# Patient Record
Sex: Male | Born: 1950
Health system: Southern US, Community
[De-identification: ages and names within clinical notes are randomized; demographics above are authoritative.]

## PROBLEM LIST (undated history)

## (undated) DIAGNOSIS — Z972 Presence of dental prosthetic device (complete) (partial): Secondary | ICD-10-CM

## (undated) DIAGNOSIS — E785 Hyperlipidemia, unspecified: Secondary | ICD-10-CM

## (undated) DIAGNOSIS — C4490 Unspecified malignant neoplasm of skin, unspecified: Secondary | ICD-10-CM

## (undated) DIAGNOSIS — N400 Enlarged prostate without lower urinary tract symptoms: Secondary | ICD-10-CM

## (undated) DIAGNOSIS — G4730 Sleep apnea, unspecified: Secondary | ICD-10-CM

## (undated) DIAGNOSIS — I10 Essential (primary) hypertension: Secondary | ICD-10-CM

## (undated) DIAGNOSIS — F419 Anxiety disorder, unspecified: Secondary | ICD-10-CM

## (undated) DIAGNOSIS — F329 Major depressive disorder, single episode, unspecified: Secondary | ICD-10-CM

## (undated) DIAGNOSIS — F32A Depression, unspecified: Secondary | ICD-10-CM

## (undated) HISTORY — DX: Unspecified malignant neoplasm of skin, unspecified: C44.90

## (undated) HISTORY — DX: Depression, unspecified: F32.A

## (undated) HISTORY — DX: Anxiety disorder, unspecified: F41.9

## (undated) HISTORY — DX: Hyperlipidemia, unspecified: E78.5

## (undated) HISTORY — DX: Major depressive disorder, single episode, unspecified: F32.9

## (undated) HISTORY — PX: HAND SURGERY: SHX662

## (undated) HISTORY — PX: APPENDECTOMY: SHX54

## (undated) HISTORY — PX: OTHER SURGICAL HISTORY: SHX169

## (undated) HISTORY — PX: SKIN CANCER EXCISION: SHX779

## (undated) HISTORY — PX: SEPTOPLASTY: SUR1290

## (undated) HISTORY — DX: Sleep apnea, unspecified: G47.30

## (undated) HISTORY — DX: Benign prostatic hyperplasia without lower urinary tract symptoms: N40.0

## (undated) HISTORY — PX: HERNIA REPAIR: SHX51

## (undated) HISTORY — DX: Essential (primary) hypertension: I10

---

## 2005-05-08 ENCOUNTER — Ambulatory Visit: Payer: Self-pay | Admitting: Family Medicine

## 2008-03-12 ENCOUNTER — Ambulatory Visit: Payer: Self-pay | Admitting: Unknown Physician Specialty

## 2009-01-01 ENCOUNTER — Ambulatory Visit: Payer: Self-pay | Admitting: Internal Medicine

## 2009-01-04 ENCOUNTER — Inpatient Hospital Stay: Payer: Self-pay | Admitting: Surgery

## 2009-01-31 ENCOUNTER — Ambulatory Visit: Payer: Self-pay | Admitting: Surgery

## 2009-03-31 ENCOUNTER — Ambulatory Visit: Payer: Self-pay | Admitting: Surgery

## 2009-04-01 ENCOUNTER — Inpatient Hospital Stay: Payer: Self-pay | Admitting: Surgery

## 2010-08-24 ENCOUNTER — Ambulatory Visit: Payer: Self-pay | Admitting: Surgery

## 2010-08-31 ENCOUNTER — Ambulatory Visit: Payer: Self-pay | Admitting: Surgery

## 2011-08-14 HISTORY — PX: COLONOSCOPY: SHX174

## 2011-08-16 ENCOUNTER — Ambulatory Visit: Payer: Self-pay | Admitting: Surgery

## 2011-09-06 ENCOUNTER — Ambulatory Visit: Payer: Self-pay | Admitting: Surgery

## 2011-09-06 LAB — BASIC METABOLIC PANEL
Anion Gap: 11 (ref 7–16)
BUN: 17 mg/dL (ref 7–18)
Calcium, Total: 9.1 mg/dL (ref 8.5–10.1)
Creatinine: 0.94 mg/dL (ref 0.60–1.30)
EGFR (African American): >60
EGFR (Non-African Amer.): >60
Glucose: 99 mg/dL (ref 65–99)
Osmolality: 279 (ref 275–301)
Potassium: 4.1 mmol/L (ref 3.5–5.1)

## 2011-09-06 LAB — CBC WITH DIFFERENTIAL/PLATELET
Basophil %: 0.6 %
Eosinophil #: 0.2 10*3/uL (ref 0.0–0.7)
Eosinophil %: 3.3 %
HCT: 43.1 % (ref 40.0–52.0)
Lymphocyte #: 1.7 10*3/uL (ref 1.0–3.6)
Lymphocyte %: 27.5 %
MCH: 30.8 pg (ref 26.0–34.0)
MCHC: 34.3 g/dL (ref 32.0–36.0)
MCV: 90 fL (ref 80–100)
Monocyte %: 13.2 %
Neutrophil #: 3.5 10*3/uL (ref 1.4–6.5)
RBC: 4.79 10*6/uL (ref 4.40–5.90)
RDW: 13.1 % (ref 11.5–14.5)

## 2011-09-13 ENCOUNTER — Ambulatory Visit: Payer: Self-pay | Admitting: Surgery

## 2011-09-14 LAB — CBC WITH DIFFERENTIAL/PLATELET
Basophil #: 0 10*3/uL (ref 0.0–0.1)
Eosinophil #: 0.2 10*3/uL (ref 0.0–0.7)
Lymphocyte #: 0.8 10*3/uL — ABNORMAL LOW (ref 1.0–3.6)
Lymphocyte %: 9.8 %
MCH: 30.5 pg (ref 26.0–34.0)
MCV: 91 fL (ref 80–100)
Monocyte #: 0.8 10*3/uL — ABNORMAL HIGH (ref 0.0–0.7)
Monocyte %: 9.6 %
Neutrophil %: 78.4 %
Platelet: 211 10*3/uL (ref 150–440)
RBC: 4.28 10*6/uL — ABNORMAL LOW (ref 4.40–5.90)
RDW: 13.2 % (ref 11.5–14.5)

## 2011-09-17 LAB — PATHOLOGY REPORT

## 2012-02-11 ENCOUNTER — Ambulatory Visit: Payer: Self-pay | Admitting: Unknown Physician Specialty

## 2012-02-12 LAB — PATHOLOGY REPORT

## 2012-06-09 ENCOUNTER — Ambulatory Visit: Payer: Self-pay | Admitting: Unknown Physician Specialty

## 2012-06-30 ENCOUNTER — Ambulatory Visit: Payer: Self-pay | Admitting: Unknown Physician Specialty

## 2012-07-22 ENCOUNTER — Ambulatory Visit: Payer: Self-pay | Admitting: Unknown Physician Specialty

## 2014-11-30 NOTE — Op Note (Signed)
PATIENT NAME:  Dennis Navarro, Dennis Navarro MR#:  [id number] DATE OF BIRTH:  05/19/1951  DATE OF PROCEDURE:  07/22/2012  PREOPERATIVE DIAGNOSIS: Left ear cholesteatoma.   POSTOPERATIVE DIAGNOSIS:  Left ear granulation tissue with significant middle ear adhesions.   PROCEDURE:  Left postauricular tympanoplasty with lysis of adhesions and removal of granulation tissue.   SURGEON: Roena Malady, M.D.   ANESTHESIA:  General endotracheal.   OPERATIVE FINDINGS: There was significant blunting of the anterior angle of the tympanic membrane with the ear canal wall as well as granulation tissue in the anterior superior quadrant. There were also adhesions from the lenticular process of the incus to the malleus. There was no evidence of cholesteatoma which I could identify.   DESCRIPTION OF PROCEDURE: Kutler was identified in the holding area, taken to the Operating Room and placed in the supine position. After general endotracheal anesthesia, the facial nerve monitor was applied in case of possible mastoidectomy. With this applied, the left ear was prepped and draped sterilely. A local anesthetic of 1% lidocaine with 1:100,000 of epinephrine was used to inject the conchal bowl, the postauricular crease, and the ear canal. A total of 5 mL was used. With the ear prepped and draped sterilely, the operating microscope was brought into the field. Examination of the ear canal showed significant debris within the ear canal which was removed. As this was traced down to the tympanic membrane, there was significant keratinous debris of the ear canal itself and so I peeled this off anteriorly. It was very difficult to see the anterior angle due to the anatomy of the ear canal itself. There did appear to be significant granulation tissue around this curve of the anterior ear canal. However, it was difficult to visualize. The superior portion of the tympanic membrane appeared to be intact as did the posterior aspect. It was felt  that a postauricular incision would be needed to view this anterior portion of the tympanic membrane. Therefore, a tympanomeatal incision was created from 6 to 12:00 in the posterior canal and a cotton ball with adrenaline was then placed. A postauricular incision was taken down to the temporalis fascia. A fascia graft was harvested for preparation of possible tympanoplasty using a graft. Hemostasis was achieved using the Bovie cautery. A V-shaped incision was done over the mastoid, taken down the pericranium where pericranial flap was elevated anteriorly and down the ear canal over the spine of Henley. This was then placed in a retractor as the ear canal was gently elevated using an endaural knife. The tympanomeatal incision which I previously created was encountered. The cotton ball was removed. This gave excellent visualization of the tympanic membrane anteriorly. There was indeed significant granulation tissue anteriorly which was cleaned off of the drum. There was a small flap anteriorly where the granulation tissue had been. I gently probed through this into the middle ear space and was able to visualize the middle ear space as if through a myringotomy incision and there was no evidence of cholesteatoma anteriorly. This small flap was then laid back into anatomic position much like a tympanoplasty graft. The tympanomeatal flap was then elevated down to the annulus. The chorda tympani nerve was identified and preserved throughout the case. There were significant fibrous bands between the chorda tympani in the annulus. These were gently divided. As the middle ear space was entered inferiorly, it appeared normal. There was no evidence of infection. No evidence of effusion. No evidence of granulation tissue inferiorly. As the dissection proceeded  superiorly, the lenticular process of the incus could be identified. There were significant fibrous bands between the lenticular process of the incus and the posterior  aspect of the handle of the malleus. These were gently lysed using the straight needle. Dissection proceeded superiorly. There did not appear to be any scutal erosion. There did not appear to be any evidence of cholesteatoma around the scutum up lateral to the edge of the malleus and incus. I felt at this point that to proceed further was not warranted, that removal of the significant granulation tissue anteriorly and lysis of adhesions would significantly improve his hearing as well as the intermittent drainage. Therefore, the tympanomeatal flap was laid back in an anatomic position. The area anteriorly was reinspected. All granulation tissue which could be visualized was removed. The tympanomeatal flap was laid back into position as was the pericranial flap. This was sutured in position using 4-0 Vicryl. The subcutaneous layers of the postauricular incision were closed using 4-0 Vicryl. The ear canal was then re-examined with the speculum. The tympanomeatal flap was laid in excellent position. The ear canal was then filled with bacitracin ointment. Two small pieces of Gelfoam impregnated with adrenaline was placed along the tympanomeatal incision. The patient was then returned to anesthesia where he was extubated in the Operating Room and taken to the recovery room in stable condition following placement of Glasscock dressing.   CULTURES: None.   SPECIMENS: None.      ESTIMATED BLOOD LOSS: Less than 20 milliliters.   ____________________________ Roena Malady, MD ctm:ap D: 07/22/2012 11:19:35 ET T: 07/22/2012 12:03:02 ET JOB#: [id number]  cc: Roena Malady, MD, <Dictator> Roena Malady MD ELECTRONICALLY SIGNED 07/25/2012 9:24

## 2014-12-05 NOTE — Op Note (Signed)
PATIENT NAME:  Dennis Navarro, Dennis Navarro MR#:  [id number] DATE OF BIRTH:  [birth year]  DATE OF PROCEDURE:  09/13/2011  PREOPERATIVE DIAGNOSIS: Recurrent ventral hernia.   POSTOPERATIVE DIAGNOSIS: Recurrent ventral hernia.   PROCEDURE PERFORMED: Ventral hernia repair.   SURGEON: Rodena Goldmann, MD   ANESTHESIA:  General.  OPERATIVE PROCEDURE: With the patient in the supine position and after the induction of appropriate general anesthesia, the patient's abdomen was prepped with ChloraPrep and draped with sterile towels. A curvilinear incision was made along the medial border of the previous incision and carried down through the subcutaneous with Bovie electrocautery. The sac was identified without difficulty and dissected back to its base in the fascia. This fascia was cleaned circumferentially. The sac was opened, the bowel reduced, and the total defect appeared to be approximately the size of a silver dollar. The sac was removed without difficulty and the edges cleaned. The fascia was released in order to allow for closure primarily. The defect was felt to be too big to accomplish satisfactory primary closure. A piece of [employer] mesh was brought to the table and placed in the defect with the smooth side down. It was sutured in place with 0 Surgilon. The repair appeared to be satisfactory. The fascia was closed over the defect and mesh using 0 Prolene. The area was infiltrated with 0.25% Marcaine for postoperative pain control. A separate stab wound was created and a JP drain placed without difficulty. The skin was clipped. The drain was secured with 3-0 nylon. Sterile dressings were applied. The patient was returned to the recovery room having tolerated the procedure well. Sponge, instrument, and needle counts were correct times two in the operating room.    ____________________________ Rodena Goldmann III, MD rle:bjt D:  09/13/2011 14:25:28 ET          T: 09/13/2011 14:54:48 ET          JOB#: [id number]  cc: Micheline Maze, MD, <Dictator> Juline Patch, MD Rodena Goldmann MD ELECTRONICALLY SIGNED 09/23/2011 21:12

## 2015-04-25 ENCOUNTER — Other Ambulatory Visit: Payer: Self-pay | Admitting: Family Medicine

## 2015-04-25 DIAGNOSIS — E785 Hyperlipidemia, unspecified: Secondary | ICD-10-CM

## 2015-05-02 ENCOUNTER — Ambulatory Visit (INDEPENDENT_AMBULATORY_CARE_PROVIDER_SITE_OTHER): Payer: BLUE CROSS/BLUE SHIELD | Admitting: Family Medicine

## 2015-05-02 ENCOUNTER — Encounter: Payer: Self-pay | Admitting: Family Medicine

## 2015-05-02 DIAGNOSIS — G4733 Obstructive sleep apnea (adult) (pediatric): Secondary | ICD-10-CM | POA: Diagnosis not present

## 2015-05-02 NOTE — Progress Notes (Addendum)
Name: Dennis Navarro   MRN: [id number]    DOB: [birth year]   Date:05/02/2015       Progress Note  Subjective  Chief Complaint  Chief Complaint  Patient presents with  . cpap    needs new CPAP and equipment- almost 64 years old    Insomnia Primary symptoms: fragmented sleep, somnolence, malaise/fatigue, napping.  The current episode started more than one year. The onset quality is gradual. The problem occurs nightly. The problem has been gradually worsening since onset. Exacerbated by: supine position/  PMH includes: associated symptoms present, no hypertension, no depression, no family stress or anxiety, no restless leg syndrome, no work related stressors, no chronic pain, apnea.     No problem-specific assessment & plan notes found for this encounter.   Past Medical History  Diagnosis Date  . Hypertension   . Hyperlipidemia   . Depression   . Anxiety     History reviewed. No pertinent past surgical history.  History reviewed. No pertinent family history.  Social History   Social History  . Marital Status: Married    Spouse Name: N/A  . Number of Children: N/A  . Years of Education: N/A   Occupational History  . Not on file.   Social History Main Topics  . Smoking status: Former Research scientist (life sciences)  . Smokeless tobacco: Not on file  . Alcohol Use: Not on file  . Drug Use: Not on file  . Sexual Activity: Yes   Other Topics Concern  . Not on file   Social History Narrative  . No narrative on file    No Known Allergies   Review of Systems  Constitutional: Positive for malaise/fatigue. Negative for fever, chills and weight loss.  HENT: Negative for ear discharge, ear pain and sore throat.   Eyes: Negative for blurred vision.  Respiratory: Positive for apnea. Negative for cough, sputum production, shortness of breath and wheezing.   Cardiovascular: Negative for chest pain, palpitations and leg swelling.  Gastrointestinal: Negative for heartburn, nausea, abdominal  pain, diarrhea, constipation, blood in stool and melena.  Genitourinary: Negative for dysuria, urgency, frequency and hematuria.  Musculoskeletal: Negative for myalgias, back pain, joint pain and neck pain.  Skin: Negative for rash.  Neurological: Negative for dizziness, tingling, sensory change, focal weakness and headaches.  Endo/Heme/Allergies: Negative for environmental allergies and polydipsia. Does not bruise/bleed easily.  Psychiatric/Behavioral: Negative for depression and suicidal ideas. The patient has insomnia. The patient is not nervous/anxious.      Objective  Filed Vitals:   05/02/15 1422  BP: 120/64  Pulse: 60  Height: 6' (1.829 m)  Weight: 209 lb (94.802 kg)    Physical Exam  Constitutional: He is oriented to person, place, and time and well-developed, well-nourished, and in no distress.  HENT:  Head: Normocephalic.  Right Ear: External ear normal.  Left Ear: External ear normal.  Nose: Nose normal.  Mouth/Throat: Oropharynx is clear and moist.  Eyes: Conjunctivae and EOM are normal. Pupils are equal, round, and reactive to light. Right eye exhibits no discharge. Left eye exhibits no discharge. No scleral icterus.  Neck: Normal range of motion. Neck supple. No JVD present. No tracheal deviation present. No thyromegaly present.  Cardiovascular: Normal rate, regular rhythm, normal heart sounds and intact distal pulses.  Exam reveals no gallop and no friction rub.   No murmur heard. Pulmonary/Chest: Breath sounds normal. No respiratory distress. He has no wheezes. He has no rales.  Abdominal: Soft. Bowel sounds are normal. He exhibits no  mass. There is no hepatosplenomegaly. There is no tenderness. There is no rebound, no guarding and no CVA tenderness.  Musculoskeletal: Normal range of motion. He exhibits no edema or tenderness.  Lymphadenopathy:    He has no cervical adenopathy.  Neurological: He is alert and oriented to person, place, and time. He has normal  sensation, normal strength, normal reflexes and intact cranial nerves. No cranial nerve deficit.  Skin: Skin is warm. No rash noted.  Psychiatric: Mood and affect normal.      Assessment & Plan  Problem List Items Addressed This Visit    None    Visit Diagnoses    Obstructive sleep apnea        prescription for new cpap and equipment         Dr. Otilio Miu [facility]  05/02/2015

## 2015-05-13 ENCOUNTER — Other Ambulatory Visit: Payer: Self-pay

## 2015-06-01 ENCOUNTER — Other Ambulatory Visit: Payer: Self-pay | Admitting: Family Medicine

## 2015-06-08 ENCOUNTER — Other Ambulatory Visit: Payer: Self-pay

## 2015-06-08 DIAGNOSIS — G4730 Sleep apnea, unspecified: Secondary | ICD-10-CM

## 2015-08-01 ENCOUNTER — Other Ambulatory Visit: Payer: Self-pay | Admitting: Family Medicine

## 2015-08-30 ENCOUNTER — Other Ambulatory Visit: Payer: Self-pay | Admitting: Family Medicine

## 2015-09-05 ENCOUNTER — Encounter: Payer: Self-pay | Admitting: Family Medicine

## 2015-09-05 ENCOUNTER — Ambulatory Visit (INDEPENDENT_AMBULATORY_CARE_PROVIDER_SITE_OTHER): Payer: BLUE CROSS/BLUE SHIELD | Admitting: Family Medicine

## 2015-09-05 VITALS — BP 140/80 | HR 78 | Temp 99.6°F | Ht 72.0 in | Wt 210.0 lb

## 2015-09-05 DIAGNOSIS — J0100 Acute maxillary sinusitis, unspecified: Secondary | ICD-10-CM

## 2015-09-05 DIAGNOSIS — R509 Fever, unspecified: Secondary | ICD-10-CM

## 2015-09-05 LAB — POCT INFLUENZA A/B
INFLUENZA A, POC: NEGATIVE
INFLUENZA B, POC: NEGATIVE

## 2015-09-05 MED ORDER — AMOXICILLIN-POT CLAVULANATE 875-125 MG PO TABS: 1.0000 | ORAL_TABLET | Freq: Two times a day (BID) | ORAL | Status: DC

## 2015-09-05 NOTE — Progress Notes (Signed)
Name: Dennis Navarro   MRN: [id number]    DOB: 11/09/1950   Date:09/05/2015       Progress Note  Subjective  Chief Complaint  Chief Complaint  Patient presents with  . Sinusitis    cough and cong    Sinusitis This is a new problem. The current episode started in the past 7 days. The problem has been gradually worsening since onset. The maximum temperature recorded prior to his arrival was 100.4 - 100.9 F. His pain is at a severity of 4/10. The pain is mild. Associated symptoms include chills, congestion, coughing, diaphoresis, headaches, a hoarse voice, sinus pressure, sneezing, a sore throat and swollen glands. Pertinent negatives include no ear pain, neck pain or shortness of breath. Past treatments include acetaminophen. The treatment provided mild relief.    No problem-specific assessment & plan notes found for this encounter.   Past Medical History  Diagnosis Date  . Hypertension   . Hyperlipidemia   . Depression   . Anxiety     Past Surgical History  Procedure Laterality Date  . Septoplasty    . Hernia repair      x 3  . Appendectomy    . Skin cancer excision      under tongue  . Hand surgery Bilateral   . Colonoscopy  2013    cleared for 5 yrs- Dr Vira Agar    History reviewed. No pertinent family history.  Social History   Social History  . Marital Status: Married    Spouse Name: N/A  . Number of Children: N/A  . Years of Education: N/A   Occupational History  . Not on file.   Social History Main Topics  . Smoking status: Former Research scientist (life sciences)  . Smokeless tobacco: Not on file  . Alcohol Use: Not on file  . Drug Use: Not on file  . Sexual Activity: Yes   Other Topics Concern  . Not on file   Social History Narrative    No Known Allergies   Review of Systems  Constitutional: Positive for fever, chills and diaphoresis. Negative for weight loss and malaise/fatigue.  HENT: Positive for congestion, hoarse voice, sinus pressure, sneezing and sore  throat. Negative for ear discharge and ear pain.   Eyes: Negative for blurred vision.  Respiratory: Positive for cough. Negative for sputum production, shortness of breath and wheezing.   Cardiovascular: Negative for chest pain, palpitations and leg swelling.  Gastrointestinal: Negative for heartburn, nausea, abdominal pain, diarrhea, constipation, blood in stool and melena.  Genitourinary: Negative for dysuria, urgency, frequency and hematuria.  Musculoskeletal: Negative for myalgias, back pain, joint pain and neck pain.  Skin: Negative for rash.  Neurological: Positive for headaches. Negative for dizziness, tingling, sensory change and focal weakness.  Endo/Heme/Allergies: Negative for environmental allergies and polydipsia. Does not bruise/bleed easily.  Psychiatric/Behavioral: Negative for depression and suicidal ideas. The patient is not nervous/anxious and does not have insomnia.      Objective  Filed Vitals:   09/05/15 0925  BP: 140/80  Pulse: 78  Temp: 99.6 F (37.6 C)  TempSrc: Oral  Height: 6' (1.829 m)  Weight: 210 lb (95.255 kg)    Physical Exam  Constitutional: He is oriented to person, place, and time and well-developed, well-nourished, and in no distress.  HENT:  Head: Normocephalic.  Right Ear: External ear normal.  Left Ear: External ear normal.  Nose: Nose normal.  Mouth/Throat: Oropharynx is clear and moist.  Eyes: Conjunctivae and EOM are normal. Pupils are equal,  round, and reactive to light. Right eye exhibits no discharge. Left eye exhibits no discharge. No scleral icterus.  Neck: Normal range of motion. Neck supple. No JVD present. No tracheal deviation present. No thyromegaly present.  Cardiovascular: Normal rate, regular rhythm, normal heart sounds and intact distal pulses.  Exam reveals no gallop and no friction rub.   No murmur heard. Pulmonary/Chest: Breath sounds normal. No respiratory distress. He has no wheezes. He has no rales.  Abdominal:  Soft. Bowel sounds are normal. He exhibits no mass. There is no hepatosplenomegaly. There is no tenderness. There is no rebound, no guarding and no CVA tenderness.  Musculoskeletal: Normal range of motion. He exhibits no edema or tenderness.  Lymphadenopathy:    He has no cervical adenopathy.  Neurological: He is alert and oriented to person, place, and time. He has normal sensation, normal strength, normal reflexes and intact cranial nerves. No cranial nerve deficit.  Skin: Skin is warm. No rash noted.  Psychiatric: Mood and affect normal.  Nursing note and vitals reviewed.     Assessment & Plan  Problem List Items Addressed This Visit    None    Visit Diagnoses    Acute maxillary sinusitis, recurrence not specified    -  Primary    Relevant Medications    amoxicillin-clavulanate (AUGMENTIN) 875-125 MG tablet    Fever and chills        Relevant Orders    POCT Influenza A/B (Completed)         Dr. [facility]  09/05/2015

## 2015-09-23 ENCOUNTER — Other Ambulatory Visit: Payer: Self-pay | Admitting: Family Medicine

## 2015-10-01 ENCOUNTER — Other Ambulatory Visit: Payer: Self-pay | Admitting: Family Medicine

## 2015-12-03 ENCOUNTER — Other Ambulatory Visit: Payer: Self-pay | Admitting: Family Medicine

## 2015-12-05 ENCOUNTER — Other Ambulatory Visit: Payer: Self-pay

## 2015-12-05 MED ORDER — PAROXETINE HCL 20 MG PO TABS: 20.0000 mg | ORAL_TABLET | Freq: Every day | ORAL | Status: DC

## 2015-12-05 NOTE — Telephone Encounter (Signed)
Refill request ok per Dr. Ronnald Ramp with refill for one month.

## 2015-12-06 ENCOUNTER — Other Ambulatory Visit: Payer: Self-pay

## 2015-12-14 ENCOUNTER — Ambulatory Visit: Payer: Self-pay | Admitting: Family Medicine

## 2015-12-15 ENCOUNTER — Encounter: Payer: Self-pay | Admitting: Family Medicine

## 2015-12-15 ENCOUNTER — Ambulatory Visit (INDEPENDENT_AMBULATORY_CARE_PROVIDER_SITE_OTHER): Payer: BLUE CROSS/BLUE SHIELD | Admitting: Family Medicine

## 2015-12-15 VITALS — BP 120/80 | HR 64 | Ht 72.0 in | Wt 208.0 lb

## 2015-12-15 DIAGNOSIS — F419 Anxiety disorder, unspecified: Secondary | ICD-10-CM

## 2015-12-15 DIAGNOSIS — E785 Hyperlipidemia, unspecified: Secondary | ICD-10-CM

## 2015-12-15 DIAGNOSIS — F418 Other specified anxiety disorders: Secondary | ICD-10-CM

## 2015-12-15 DIAGNOSIS — E782 Mixed hyperlipidemia: Secondary | ICD-10-CM | POA: Insufficient documentation

## 2015-12-15 DIAGNOSIS — I10 Essential (primary) hypertension: Secondary | ICD-10-CM

## 2015-12-15 DIAGNOSIS — F329 Major depressive disorder, single episode, unspecified: Secondary | ICD-10-CM

## 2015-12-15 DIAGNOSIS — F32A Depression, unspecified: Secondary | ICD-10-CM | POA: Insufficient documentation

## 2015-12-15 MED ORDER — BUPROPION HCL ER (SR) 100 MG PO TB12: 100.0000 mg | ORAL_TABLET | Freq: Two times a day (BID) | ORAL | Status: DC

## 2015-12-15 MED ORDER — PAROXETINE HCL 20 MG PO TABS: 20.0000 mg | ORAL_TABLET | Freq: Every day | ORAL | Status: DC

## 2015-12-15 MED ORDER — ATORVASTATIN CALCIUM 40 MG PO TABS: 40.0000 mg | ORAL_TABLET | Freq: Every day | ORAL | Status: DC

## 2015-12-15 MED ORDER — LOSARTAN POTASSIUM 100 MG PO TABS: 100.0000 mg | ORAL_TABLET | Freq: Every day | ORAL | Status: DC

## 2015-12-15 NOTE — Progress Notes (Signed)
Name: Dennis Navarro   MRN: [id number]    DOB: Mar 23, 1951   Date:12/15/2015       Progress Note  Subjective  Chief Complaint  Chief Complaint  Patient presents with  . Hypertension  . Hyperlipidemia  . Depression  . Anxiety    Hypertension This is a chronic problem. The current episode started more than 1 year ago. The problem has been gradually improving since onset. The problem is controlled. Pertinent negatives include no anxiety, blurred vision, chest pain, headaches, malaise/fatigue, neck pain, orthopnea, palpitations, peripheral edema, PND, shortness of breath or sweats. There are no associated agents to hypertension. There are no known risk factors for coronary artery disease. Past treatments include angiotensin blockers. The current treatment provides moderate improvement. There are no compliance problems.  There is no history of angina, kidney disease, CAD/MI, CVA, heart failure, left ventricular hypertrophy, PVD, renovascular disease or retinopathy. There is no history of chronic renal disease or a hypertension causing med.  Hyperlipidemia This is a chronic problem. The current episode started more than 1 year ago. The problem is controlled. He has no history of chronic renal disease, diabetes, hypothyroidism, liver disease, obesity or nephrotic syndrome. There are no known factors aggravating his hyperlipidemia. Pertinent negatives include no chest pain, focal sensory loss, focal weakness, leg pain, myalgias or shortness of breath. Current antihyperlipidemic treatment includes statins. The current treatment provides moderate improvement of lipids. There are no compliance problems.  Risk factors for coronary artery disease include hypertension and male sex.  Depression        This is a chronic problem.  The current episode started more than 1 year ago.   The onset quality is gradual.   The problem has been gradually improving since onset.  Associated symptoms include no decreased  concentration, no fatigue, no helplessness, no hopelessness, does not have insomnia, not irritable, no restlessness, no decreased interest, no appetite change, no body aches, no myalgias, no headaches, no indigestion, not sad and no suicidal ideas.     The symptoms are aggravated by nothing.  Past treatments include SSRIs - Selective serotonin reuptake inhibitors.   Pertinent negatives include no hypothyroidism and no anxiety. Anxiety Presents for follow-up visit. Patient reports no chest pain, confusion, decreased concentration, dizziness, excessive worry, feeling of choking, insomnia, nausea, nervous/anxious behavior, palpitations, restlessness, shortness of breath or suicidal ideas. The severity of symptoms is mild.   Past treatments include SSRIs and non-SSRI antidepressants. Compliance with prior treatments has been variable.    No problem-specific assessment & plan notes found for this encounter.   Past Medical History  Diagnosis Date  . Hypertension   . Hyperlipidemia   . Depression   . Anxiety     Past Surgical History  Procedure Laterality Date  . Septoplasty    . Hernia repair      x 3  . Appendectomy    . Skin cancer excision      under tongue  . Hand surgery Bilateral   . Colonoscopy  2013    cleared for 5 yrs- Dr Vira Agar    History reviewed. No pertinent family history.  Social History   Social History  . Marital Status: Married    Spouse Name: N/A  . Number of Children: N/A  . Years of Education: N/A   Occupational History  . Not on file.   Social History Main Topics  . Smoking status: Former Research scientist (life sciences)  . Smokeless tobacco: Not on file  . Alcohol Use: Not on  file  . Drug Use: Not on file  . Sexual Activity: Yes   Other Topics Concern  . Not on file   Social History Narrative    No Known Allergies   Review of Systems  Constitutional: Negative for fever, chills, weight loss, malaise/fatigue, appetite change and fatigue.  HENT: Negative for ear  discharge, ear pain and sore throat.   Eyes: Negative for blurred vision.  Respiratory: Negative for cough, sputum production, shortness of breath and wheezing.   Cardiovascular: Negative for chest pain, palpitations, orthopnea, leg swelling and PND.  Gastrointestinal: Negative for heartburn, nausea, abdominal pain, diarrhea, constipation, blood in stool and melena.  Genitourinary: Negative for dysuria, urgency, frequency and hematuria.  Musculoskeletal: Negative for myalgias, back pain, joint pain and neck pain.  Skin: Negative for rash.  Neurological: Negative for dizziness, tingling, sensory change, focal weakness and headaches.  Endo/Heme/Allergies: Negative for environmental allergies and polydipsia. Does not bruise/bleed easily.  Psychiatric/Behavioral: Positive for depression. Negative for suicidal ideas, confusion and decreased concentration. The patient is not nervous/anxious and does not have insomnia.      Objective  Filed Vitals:   12/15/15 1015  BP: 120/80  Pulse: 64  Height: 6' (1.829 m)  Weight: 208 lb (94.348 kg)    Physical Exam  Constitutional: He is oriented to person, place, and time and well-developed, well-nourished, and in no distress. He is not irritable.  HENT:  Head: Normocephalic.  Right Ear: External ear normal.  Left Ear: External ear normal.  Nose: Nose normal.  Mouth/Throat: Oropharynx is clear and moist.  Eyes: Conjunctivae and EOM are normal. Pupils are equal, round, and reactive to light. Right eye exhibits no discharge. Left eye exhibits no discharge. No scleral icterus.  Neck: Normal range of motion. Neck supple. No JVD present. No tracheal deviation present. No thyromegaly present.  Cardiovascular: Normal rate, regular rhythm, normal heart sounds and intact distal pulses.  Exam reveals no gallop and no friction rub.   No murmur heard. Pulmonary/Chest: Breath sounds normal. No respiratory distress. He has no wheezes. He has no rales.   Abdominal: Soft. Bowel sounds are normal. He exhibits no mass. There is no hepatosplenomegaly. There is no tenderness. There is no rebound, no guarding and no CVA tenderness.  Musculoskeletal: Normal range of motion. He exhibits no edema or tenderness.  Lymphadenopathy:    He has no cervical adenopathy.  Neurological: He is alert and oriented to person, place, and time. He has normal sensation, normal strength, normal reflexes and intact cranial nerves. No cranial nerve deficit.  Skin: Skin is warm. No rash noted.  Psychiatric: Mood and affect normal.  Nursing note and vitals reviewed.     Assessment & Plan  Problem List Items Addressed This Visit      Cardiovascular and Mediastinum   Essential hypertension - Primary   Relevant Medications   atorvastatin (LIPITOR) 40 MG tablet   losartan (COZAAR) 100 MG tablet   Other Relevant Orders   Renal Function Panel   Hepatic Function Panel (6)     Other   Hyperlipidemia   Relevant Medications   atorvastatin (LIPITOR) 40 MG tablet   losartan (COZAAR) 100 MG tablet   Other Relevant Orders   Lipid Profile   Hepatic Function Panel (6)   Anxiety and depression   Relevant Medications   buPROPion (WELLBUTRIN SR) 100 MG 12 hr tablet   PARoxetine (PAXIL) 20 MG tablet        Dr. [facility]  12/15/2015

## 2015-12-16 DIAGNOSIS — E785 Hyperlipidemia, unspecified: Secondary | ICD-10-CM | POA: Diagnosis not present

## 2015-12-16 DIAGNOSIS — I10 Essential (primary) hypertension: Secondary | ICD-10-CM | POA: Diagnosis not present

## 2015-12-17 LAB — RENAL FUNCTION PANEL
Albumin: 4.3 g/dL (ref 3.6–4.8)
BUN / CREAT RATIO: 17 (ref 10–24)
BUN: 16 mg/dL (ref 8–27)
CHLORIDE: 102 mmol/L (ref 96–106)
CO2: 22 mmol/L (ref 18–29)
Calcium: 9.1 mg/dL (ref 8.6–10.2)
Creatinine, Ser: 0.93 mg/dL (ref 0.76–1.27)
GFR calc non Af Amer: 86 mL/min/{1.73_m2} (ref >59)
GFR, EST AFRICAN AMERICAN: 100 mL/min/{1.73_m2} (ref >59)
GLUCOSE: 104 mg/dL — AB (ref 65–99)
POTASSIUM: 4.7 mmol/L (ref 3.5–5.2)
Phosphorus: 3.1 mg/dL (ref 2.5–4.5)
SODIUM: 144 mmol/L (ref 134–144)

## 2015-12-17 LAB — LIPID PANEL
Chol/HDL Ratio: 2 ratio units (ref 0.0–5.0)
Cholesterol, Total: 175 mg/dL (ref 100–199)
HDL: 89 mg/dL (ref >39)
LDL Calculated: 66 mg/dL (ref 0–99)
Triglycerides: 100 mg/dL (ref 0–149)
VLDL Cholesterol Cal: 20 mg/dL (ref 5–40)

## 2015-12-17 LAB — HEPATIC FUNCTION PANEL (6)
ALT: 35 IU/L (ref 0–44)
AST: 23 IU/L (ref 0–40)
Alkaline Phosphatase: 46 IU/L (ref 39–117)
BILIRUBIN TOTAL: 0.3 mg/dL (ref 0.0–1.2)
Bilirubin, Direct: 0.1 mg/dL (ref 0.00–0.40)

## 2015-12-22 ENCOUNTER — Other Ambulatory Visit: Payer: Self-pay | Admitting: Family Medicine

## 2016-01-22 ENCOUNTER — Ambulatory Visit
Admission: EM | Admit: 2016-01-22 | Discharge: 2016-01-22 | Disposition: A | Discharge status: Home / Self Care | Payer: BLUE CROSS/BLUE SHIELD | Attending: Family Medicine | Admitting: Family Medicine

## 2016-01-22 ENCOUNTER — Encounter: Payer: Self-pay | Admitting: *Deleted

## 2016-01-22 DIAGNOSIS — Z23 Encounter for immunization: Secondary | ICD-10-CM | POA: Diagnosis not present

## 2016-01-22 DIAGNOSIS — S61211A Laceration without foreign body of left index finger without damage to nail, initial encounter: Secondary | ICD-10-CM

## 2016-01-22 DIAGNOSIS — S61210A Laceration without foreign body of right index finger without damage to nail, initial encounter: Secondary | ICD-10-CM | POA: Diagnosis not present

## 2016-01-22 MED ORDER — TETANUS-DIPHTH-ACELL PERTUSSIS 5-2.5-18.5 LF-MCG/0.5 IM SUSP
0.5000 mL | Freq: Once | INTRAMUSCULAR | Status: AC
  Administered 2016-01-22: 0.5 mL via INTRAMUSCULAR

## 2016-01-22 NOTE — ED Provider Notes (Addendum)
CSN: [id number]     Arrival date & time 01/22/16  1214 History   First MD Initiated Contact with Patient 01/22/16 1300     Chief Complaint  Patient presents with  . Laceration   (Consider location/radiation/quality/duration/timing/severity/associated sxs/prior Treatment) HPI Comments: 65 yo male with a left index finger laceration that occurred about 3 hours ago. States was cleaning a fillet knife when he cut his left index finger. States he cleaned the laceration wound and has been applying pressure. States he can move his finger fine without any problems. Does not recall his last tetanus immunization.   The history is provided by the patient.    Past Medical History  Diagnosis Date  . Hypertension   . Hyperlipidemia   . Depression   . Anxiety    Past Surgical History  Procedure Laterality Date  . Septoplasty    . Hernia repair      x 3  . Appendectomy    . Skin cancer excision      under tongue  . Hand surgery Bilateral   . Colonoscopy  2013    cleared for 5 yrs- Dr Vira Agar   History reviewed. No pertinent family history. Social History  Substance Use Topics  . Smoking status: Former Research scientist (life sciences)  . Smokeless tobacco: None  . Alcohol Use: Yes    Review of Systems  Allergies  Review of patient's allergies indicates no known allergies.  Home Medications   Prior to Admission medications   Medication Sig Start Date End Date Taking? Authorizing Provider  aspirin EC 81 MG tablet Take 81 mg by mouth daily.   Yes Historical Provider, MD  atorvastatin (LIPITOR) 40 MG tablet Take 1 tablet (40 mg total) by mouth daily. 12/15/15  Yes Juline Patch, MD  buPROPion (WELLBUTRIN SR) 100 MG 12 hr tablet Take 1 tablet (100 mg total) by mouth 2 (two) times daily. 12/15/15  Yes Juline Patch, MD  losartan (COZAAR) 100 MG tablet Take 1 tablet (100 mg total) by mouth daily. 12/15/15  Yes Juline Patch, MD  losartan (COZAAR) 100 MG tablet TAKE 1 TABLET BY MOUTH EVERY DAY 12/22/15  Yes Juline Patch, MD  Multiple Vitamin (MULTI-VITAMINS) TABS Take 1 tablet by mouth daily.   Yes Historical Provider, MD  PARoxetine (PAXIL) 20 MG tablet Take 1 tablet (20 mg total) by mouth daily. 12/15/15  Yes Juline Patch, MD  docusate sodium (COLACE) 100 MG capsule Take 100 mg by mouth as needed. otc 11/02/14   Historical Provider, MD   Meds Ordered and Administered this Visit   Medications  Tdap (BOOSTRIX) injection 0.5 mL (0.5 mLs Intramuscular Given 01/22/16 1252)    BP 150/83 mmHg  Pulse 77  Temp(Src) 98.6 F (37 C) (Oral)  Resp 16  Ht 6' (1.829 m)  Wt 209 lb (94.802 kg)  BMI 28.34 kg/m2  SpO2 98% No data found.   Physical Exam  Constitutional: He appears well-developed and well-nourished. No distress.  Musculoskeletal:       Left hand: He exhibits tenderness (over skin laceration) and laceration (2 cm laceration over medial side of left index finger; normal ROM; no active bleeding). He exhibits normal range of motion, no bony tenderness, normal two-point discrimination, normal capillary refill, no deformity and no swelling. Normal sensation noted. Normal strength noted.       Hands: Hand/fingers neurovascularly intact; normal range of motion of fingers  Neurological: He is alert.  Skin: He is not diaphoretic.  Nursing note  and vitals reviewed.   ED Course  Procedures (including critical care time)  Labs Review Labs Reviewed - No data to display  Imaging Review No results found.   Visual Acuity Review  Right Eye Distance:   Left Eye Distance:   Bilateral Distance:    Right Eye Near:   Left Eye Near:    Bilateral Near:         MDM   1. Laceration of index finger of left hand without complication, initial encounter    Discharge Medication List as of 01/22/2016  1:49 PM     1.diagnosis reviewed with patient; wound cleaned and prepped in sterile fashion; no foreign bodies or debris noted; area anesthetized with 1% lidocaine; 4 interrupted sutures placed with 4.0  Nylon with good wound approximation/closure. Patient tolerated procedure well. Wound bandaged. Verbal and written information for wound care given. Patient also given Tdap immunization. Follow up in 8-10 days for suture removal or sooner prn if any problems.   Norval Gable, MD 01/24/16 [employer], MD 01/24/16 [phone]

## 2016-01-22 NOTE — ED Notes (Signed)
1/2" laceration to left index finger, cut with fillet knife while cleaning fish

## 2016-01-22 NOTE — Discharge Instructions (Signed)

## 2016-01-31 ENCOUNTER — Encounter: Payer: Self-pay | Admitting: Emergency Medicine

## 2016-01-31 ENCOUNTER — Ambulatory Visit
Admission: EM | Admit: 2016-01-31 | Discharge: 2016-01-31 | Disposition: A | Discharge status: Home / Self Care | Payer: BLUE CROSS/BLUE SHIELD

## 2016-01-31 DIAGNOSIS — Z4802 Encounter for removal of sutures: Secondary | ICD-10-CM | POA: Diagnosis not present

## 2016-01-31 NOTE — ED Notes (Signed)
Patient here to have sutures removed from his left 2nd finger.  Site shows no signs of infection.

## 2016-06-12 DIAGNOSIS — G4733 Obstructive sleep apnea (adult) (pediatric): Secondary | ICD-10-CM | POA: Diagnosis not present

## 2016-06-16 ENCOUNTER — Other Ambulatory Visit: Payer: Self-pay | Admitting: Family Medicine

## 2016-06-16 DIAGNOSIS — F329 Major depressive disorder, single episode, unspecified: Secondary | ICD-10-CM

## 2016-06-16 DIAGNOSIS — F419 Anxiety disorder, unspecified: Secondary | ICD-10-CM

## 2016-06-16 DIAGNOSIS — F32A Depression, unspecified: Secondary | ICD-10-CM

## 2016-06-16 DIAGNOSIS — E785 Hyperlipidemia, unspecified: Secondary | ICD-10-CM

## 2016-07-09 DIAGNOSIS — Z08 Encounter for follow-up examination after completed treatment for malignant neoplasm: Secondary | ICD-10-CM | POA: Diagnosis not present

## 2016-07-09 DIAGNOSIS — Z85828 Personal history of other malignant neoplasm of skin: Secondary | ICD-10-CM | POA: Diagnosis not present

## 2016-07-09 DIAGNOSIS — I788 Other diseases of capillaries: Secondary | ICD-10-CM | POA: Diagnosis not present

## 2016-07-09 DIAGNOSIS — L570 Actinic keratosis: Secondary | ICD-10-CM | POA: Diagnosis not present

## 2016-07-09 DIAGNOSIS — Z1283 Encounter for screening for malignant neoplasm of skin: Secondary | ICD-10-CM | POA: Diagnosis not present

## 2016-08-20 ENCOUNTER — Other Ambulatory Visit: Payer: Self-pay | Admitting: Family Medicine

## 2016-08-20 DIAGNOSIS — F419 Anxiety disorder, unspecified: Principal | ICD-10-CM

## 2016-08-20 DIAGNOSIS — F329 Major depressive disorder, single episode, unspecified: Secondary | ICD-10-CM

## 2016-09-04 ENCOUNTER — Ambulatory Visit (INDEPENDENT_AMBULATORY_CARE_PROVIDER_SITE_OTHER): Payer: BLUE CROSS/BLUE SHIELD | Admitting: Family Medicine

## 2016-09-04 VITALS — BP 102/62 | HR 98 | Temp 99.7°F | Ht 72.0 in | Wt 215.0 lb

## 2016-09-04 DIAGNOSIS — J0100 Acute maxillary sinusitis, unspecified: Secondary | ICD-10-CM

## 2016-09-04 LAB — POCT INFLUENZA A/B
INFLUENZA A, POC: NEGATIVE
INFLUENZA B, POC: NEGATIVE

## 2016-09-04 MED ORDER — AMOXICILLIN 500 MG PO CAPS: 500.0000 mg | ORAL_CAPSULE | Freq: Three times a day (TID) | ORAL | 0 refills | Status: DC

## 2016-09-04 NOTE — Progress Notes (Signed)
Name: Dennis Navarro   MRN: [id number]    DOB: 1951/07/30   Date:09/04/2016       Progress Note  Subjective  Chief Complaint  Chief Complaint  Patient presents with  . Sinusitis    started yesterday pm with sore throat and 99.9 fever. Hurting across forehead, has cong and sinus drainage    Sinusitis  This is a new problem. The current episode started in the past 7 days. The problem has been gradually worsening since onset. The maximum temperature recorded prior to his arrival was 101 - 101.9 F. The fever has been present for 1 to 2 days. Associated symptoms include chills, congestion, coughing, diaphoresis, sinus pressure and a sore throat. Pertinent negatives include no ear pain, headaches, hoarse voice, neck pain, shortness of breath or sneezing. Past treatments include oral decongestants and acetaminophen. The treatment provided mild relief.  Fever   This is a new problem. The current episode started yesterday. The problem occurs intermittently. The problem has been waxing and waning. The maximum temperature noted was 99 to 99.9 F. The temperature was taken using an oral thermometer. Associated symptoms include congestion, coughing and a sore throat. Pertinent negatives include no abdominal pain, chest pain, diarrhea, ear pain, headaches, nausea, rash, urinary pain or wheezing.    No problem-specific Assessment & Plan notes found for this encounter.   Past Medical History:  Diagnosis Date  . Anxiety   . Depression   . Hyperlipidemia   . Hypertension     Past Surgical History:  Procedure Laterality Date  . APPENDECTOMY    . COLONOSCOPY  2013   cleared for 5 yrs- Dr Vira Agar  . HAND SURGERY Bilateral   . HERNIA REPAIR     x 3  . SEPTOPLASTY    . SKIN CANCER EXCISION     under tongue    No family history on file.  Social History   Social History  . Marital status: Married    Spouse name: N/A  . Number of children: N/A  . Years of education: N/A   Occupational  History  . Not on file.   Social History Main Topics  . Smoking status: Former Research scientist (life sciences)  . Smokeless tobacco: Not on file  . Alcohol use Yes  . Drug use: Unknown  . Sexual activity: Yes   Other Topics Concern  . Not on file   Social History Narrative  . No narrative on file    No Known Allergies   Review of Systems  Constitutional: Positive for chills and diaphoresis. Negative for fever, malaise/fatigue and weight loss.  HENT: Positive for congestion, sinus pressure and sore throat. Negative for ear discharge, ear pain, hoarse voice and sneezing.   Eyes: Negative for blurred vision.  Respiratory: Positive for cough. Negative for sputum production, shortness of breath and wheezing.   Cardiovascular: Negative for chest pain, palpitations and leg swelling.  Gastrointestinal: Negative for abdominal pain, blood in stool, constipation, diarrhea, heartburn, melena and nausea.  Genitourinary: Negative for dysuria, frequency, hematuria and urgency.  Musculoskeletal: Negative for back pain, joint pain, myalgias and neck pain.  Skin: Negative for rash.  Neurological: Negative for dizziness, tingling, sensory change, focal weakness and headaches.  Endo/Heme/Allergies: Negative for environmental allergies and polydipsia. Does not bruise/bleed easily.  Psychiatric/Behavioral: Negative for depression and suicidal ideas. The patient is not nervous/anxious and does not have insomnia.      Objective  Vitals:   09/04/16 1014  BP: 102/62  Pulse: 98  Temp: 99.7  F (37.6 C)  TempSrc: Oral  Weight: 215 lb (97.5 kg)  Height: 6' (1.829 m)    Physical Exam  Constitutional: He is oriented to person, place, and time and well-developed, well-nourished, and in no distress.  HENT:  Head: Normocephalic.  Right Ear: External ear normal.  Left Ear: External ear normal.  Nose: Nose normal.  Mouth/Throat: Oropharynx is clear and moist. No oropharyngeal exudate, posterior oropharyngeal edema or  posterior oropharyngeal erythema.  Eyes: Conjunctivae and EOM are normal. Pupils are equal, round, and reactive to light. Right eye exhibits no discharge. Left eye exhibits no discharge. No scleral icterus.  Neck: Normal range of motion. Neck supple. No JVD present. No tracheal deviation present. No thyromegaly present.  Cardiovascular: Normal rate, regular rhythm, normal heart sounds and intact distal pulses.  Exam reveals no gallop and no friction rub.   No murmur heard. Pulmonary/Chest: Breath sounds normal. No respiratory distress. He has no wheezes. He has no rales.  Abdominal: Soft. Bowel sounds are normal. He exhibits no mass. There is no hepatosplenomegaly. There is no tenderness. There is no rebound, no guarding and no CVA tenderness.  Musculoskeletal: Normal range of motion. He exhibits no edema or tenderness.  Lymphadenopathy:    He has no cervical adenopathy.  Neurological: He is alert and oriented to person, place, and time. He has normal sensation, normal strength, normal reflexes and intact cranial nerves. No cranial nerve deficit.  Skin: Skin is warm. No rash noted.  Psychiatric: Mood and affect normal.  Nursing note and vitals reviewed.     Assessment & Plan  Problem List Items Addressed This Visit    None    Visit Diagnoses    Acute maxillary sinusitis, recurrence not specified    -  Primary   Relevant Medications   amoxicillin (AMOXIL) 500 MG capsule   Other Relevant Orders   POCT Influenza A/B (Completed)        Dr. [facility]  09/04/16

## 2016-09-12 ENCOUNTER — Ambulatory Visit (INDEPENDENT_AMBULATORY_CARE_PROVIDER_SITE_OTHER): Payer: BLUE CROSS/BLUE SHIELD | Admitting: Family Medicine

## 2016-09-12 VITALS — BP 120/80 | HR 80 | Ht 72.0 in | Wt 213.0 lb

## 2016-09-12 DIAGNOSIS — F32A Depression, unspecified: Secondary | ICD-10-CM

## 2016-09-12 DIAGNOSIS — Z1211 Encounter for screening for malignant neoplasm of colon: Secondary | ICD-10-CM | POA: Diagnosis not present

## 2016-09-12 DIAGNOSIS — F418 Other specified anxiety disorders: Secondary | ICD-10-CM | POA: Diagnosis not present

## 2016-09-12 DIAGNOSIS — R351 Nocturia: Secondary | ICD-10-CM | POA: Insufficient documentation

## 2016-09-12 DIAGNOSIS — I10 Essential (primary) hypertension: Secondary | ICD-10-CM | POA: Diagnosis not present

## 2016-09-12 DIAGNOSIS — E782 Mixed hyperlipidemia: Secondary | ICD-10-CM | POA: Diagnosis not present

## 2016-09-12 DIAGNOSIS — F329 Major depressive disorder, single episode, unspecified: Secondary | ICD-10-CM

## 2016-09-12 DIAGNOSIS — Z23 Encounter for immunization: Secondary | ICD-10-CM | POA: Diagnosis not present

## 2016-09-12 DIAGNOSIS — F419 Anxiety disorder, unspecified: Secondary | ICD-10-CM

## 2016-09-12 HISTORY — DX: Nocturia: R35.1

## 2016-09-12 LAB — HEMOCCULT GUIAC POC 1CARD (OFFICE): FECAL OCCULT BLD: NEGATIVE

## 2016-09-12 MED ORDER — ATORVASTATIN CALCIUM 40 MG PO TABS: 40.0000 mg | ORAL_TABLET | Freq: Every day | ORAL | 2 refills | Status: DC

## 2016-09-12 MED ORDER — LOSARTAN POTASSIUM 100 MG PO TABS: 100.0000 mg | ORAL_TABLET | Freq: Every day | ORAL | 1 refills | Status: DC

## 2016-09-12 MED ORDER — PAROXETINE HCL 20 MG PO TABS: 20.0000 mg | ORAL_TABLET | Freq: Every day | ORAL | 2 refills | Status: DC

## 2016-09-12 MED ORDER — BUPROPION HCL ER (SR) 100 MG PO TB12: 100.0000 mg | ORAL_TABLET | Freq: Two times a day (BID) | ORAL | 2 refills | Status: DC

## 2016-09-12 NOTE — Progress Notes (Signed)
Name: Dennis Navarro   MRN: [id number]    DOB: 03-25-1951   Date:09/12/2016       Progress Note  Subjective  Chief Complaint  Chief Complaint  Patient presents with  . Hyperlipidemia  . Hypertension  . Depression  . Anxiety    Hyperlipidemia  This is a chronic problem. The problem is controlled. Recent lipid tests were reviewed and are normal. He has no history of chronic renal disease, diabetes, hypothyroidism, liver disease, obesity or nephrotic syndrome. There are no known factors aggravating his hyperlipidemia. Pertinent negatives include no chest pain, focal sensory loss, focal weakness, leg pain, myalgias or shortness of breath. He is currently on no antihyperlipidemic treatment. The current treatment provides mild improvement of lipids. There are no compliance problems.   Hypertension  This is a chronic problem. The current episode started more than 1 year ago. The problem has been gradually improving since onset. Associated symptoms include anxiety. Pertinent negatives include no blurred vision, chest pain, headaches, malaise/fatigue, neck pain, orthopnea, palpitations, peripheral edema, PND, shortness of breath or sweats. There are no associated agents to hypertension. There are no known risk factors for coronary artery disease. Past treatments include angiotensin blockers. The current treatment provides moderate improvement. There are no compliance problems.  There is no history of angina, kidney disease, CAD/MI, CVA, heart failure, left ventricular hypertrophy, PVD, renovascular disease or retinopathy. There is no history of chronic renal disease or a hypertension causing med.  Depression       The patient presents with depression.  This is a chronic problem.  The current episode started more than 1 year ago.   The problem occurs intermittently.  Associated symptoms include no decreased concentration, no fatigue, no helplessness, no hopelessness, does not have insomnia, not irritable,  no restlessness, no decreased interest, no appetite change, no body aches, no myalgias, no headaches, no indigestion, not sad and no suicidal ideas.     The symptoms are aggravated by nothing.  Past treatments include SSRIs - Selective serotonin reuptake inhibitors (and welbutrin).  Compliance with treatment is good.  Past medical history includes recent illness, anxiety and depression.     Pertinent negatives include no chronic fatigue syndrome, no chronic pain, no fibromyalgia, no hypothyroidism, no physical disability, no terminal illness, no recent psychiatric admission, no dementia, no eating disorder and no mental health disorder. Anxiety  Presents for follow-up visit. Patient reports no chest pain, compulsions, confusion, decreased concentration, depressed mood, dizziness, dry mouth, excessive worry, feeling of choking, hyperventilation, impotence, insomnia, irritability, malaise, muscle tension, nausea, nervous/anxious behavior, obsessions, palpitations, panic, restlessness, shortness of breath or suicidal ideas. Symptoms occur occasionally. The quality of sleep is good.   His past medical history is significant for depression.    No problem-specific Assessment & Plan notes found for this encounter.   Past Medical History:  Diagnosis Date  . Anxiety   . Depression   . Hyperlipidemia   . Hypertension     Past Surgical History:  Procedure Laterality Date  . APPENDECTOMY    . COLONOSCOPY  2013   cleared for 5 yrs- Dr Vira Agar  . HAND SURGERY Bilateral   . HERNIA REPAIR     x 3  . SEPTOPLASTY    . SKIN CANCER EXCISION     under tongue    No family history on file.  Social History   Social History  . Marital status: Married    Spouse name: N/A  . Number of children: N/A  .  Years of education: N/A   Occupational History  . Not on file.   Social History Main Topics  . Smoking status: Former Research scientist (life sciences)  . Smokeless tobacco: Not on file  . Alcohol use Yes  . Drug use:  Unknown  . Sexual activity: Yes   Other Topics Concern  . Not on file   Social History Narrative  . No narrative on file    No Known Allergies   Review of Systems  Constitutional: Negative for appetite change, chills, fatigue, fever, irritability, malaise/fatigue and weight loss.  HENT: Negative for ear discharge, ear pain and sore throat.   Eyes: Negative for blurred vision.  Respiratory: Negative for cough, sputum production, shortness of breath and wheezing.   Cardiovascular: Negative for chest pain, palpitations, orthopnea, leg swelling and PND.  Gastrointestinal: Negative for abdominal pain, blood in stool, constipation, diarrhea, heartburn, melena and nausea.  Genitourinary: Negative for dysuria, frequency, hematuria, impotence and urgency.       Nocturia  Musculoskeletal: Negative for back pain, joint pain, myalgias and neck pain.  Skin: Negative for rash.  Neurological: Negative for dizziness, tingling, sensory change, focal weakness and headaches.  Endo/Heme/Allergies: Negative for environmental allergies and polydipsia. Does not bruise/bleed easily.  Psychiatric/Behavioral: Positive for depression. Negative for confusion, decreased concentration and suicidal ideas. The patient is not nervous/anxious and does not have insomnia.      Objective  Vitals:   09/12/16 0817  BP: 120/80  Pulse: 80  Weight: 213 lb (96.6 kg)  Height: 6' (1.829 m)    Physical Exam  Constitutional: He is oriented to person, place, and time and well-developed, well-nourished, and in no distress. He is not irritable.  HENT:  Head: Normocephalic.  Right Ear: External ear normal.  Left Ear: External ear normal.  Nose: Nose normal.  Mouth/Throat: Oropharynx is clear and moist.  Eyes: Conjunctivae and EOM are normal. Pupils are equal, round, and reactive to light. Right eye exhibits no discharge. Left eye exhibits no discharge. No scleral icterus.  Neck: Normal range of motion. Neck supple. No  JVD present. No tracheal deviation present. No thyromegaly present.  Cardiovascular: Normal rate, regular rhythm, normal heart sounds and intact distal pulses.  Exam reveals no gallop and no friction rub.   No murmur heard. Pulmonary/Chest: Breath sounds normal. No respiratory distress. He has no wheezes. He has no rales.  Abdominal: Soft. Bowel sounds are normal. He exhibits no mass. There is no hepatosplenomegaly. There is no tenderness. There is no rebound, no guarding and no CVA tenderness.  Genitourinary: Rectum normal and prostate normal. Rectal exam shows guaiac negative stool.  Musculoskeletal: Normal range of motion. He exhibits no edema or tenderness.  Lymphadenopathy:    He has no cervical adenopathy.  Neurological: He is alert and oriented to person, place, and time. He has normal sensation, normal strength, normal reflexes and intact cranial nerves. No cranial nerve deficit.  Skin: Skin is warm. No rash noted.  Psychiatric: Mood and affect normal.  Nursing note and vitals reviewed.     Assessment & Plan  Problem List Items Addressed This Visit      Cardiovascular and Mediastinum   Essential hypertension - Primary   Relevant Medications   losartan (COZAAR) 100 MG tablet   atorvastatin (LIPITOR) 40 MG tablet   Other Relevant Orders   Renal function panel     Other   Anxiety and depression   Relevant Medications   PARoxetine (PAXIL) 20 MG tablet   buPROPion (WELLBUTRIN SR) 100  MG 12 hr tablet   Mixed hyperlipidemia   Relevant Medications   losartan (COZAAR) 100 MG tablet   atorvastatin (LIPITOR) 40 MG tablet   Other Relevant Orders   Lipid panel   Nocturia   Relevant Orders   PSA    Other Visit Diagnoses    Immunization due       Relevant Orders   Pneumococcal conjugate vaccine 13-valent IM (Completed)   Flu Vaccine QUAD 36+ mos PF IM (Fluarix & Fluzone Quad PF) (Completed)   Colon cancer screening       Relevant Orders   POCT occult blood stool  (Completed)     I spent 30 minutes with this patient, More than 50% of that time was spent in face to face education, counseling and care coordination.   Dr. [facility]  09/12/16

## 2016-09-13 LAB — RENAL FUNCTION PANEL
Albumin: 4.5 g/dL (ref 3.6–4.8)
BUN / CREAT RATIO: 21 (ref 10–24)
BUN: 15 mg/dL (ref 8–27)
CALCIUM: 9.2 mg/dL (ref 8.6–10.2)
CHLORIDE: 102 mmol/L (ref 96–106)
CO2: 24 mmol/L (ref 18–29)
Creatinine, Ser: 0.73 mg/dL — ABNORMAL LOW (ref 0.76–1.27)
GFR calc Af Amer: 113 mL/min/{1.73_m2} (ref >59)
GFR calc non Af Amer: 97 mL/min/{1.73_m2} (ref >59)
GLUCOSE: 123 mg/dL — AB (ref 65–99)
Phosphorus: 2.3 mg/dL — ABNORMAL LOW (ref 2.5–4.5)
Potassium: 4.4 mmol/L (ref 3.5–5.2)
SODIUM: 143 mmol/L (ref 134–144)

## 2016-09-13 LAB — LIPID PANEL
CHOLESTEROL TOTAL: 174 mg/dL (ref 100–199)
Chol/HDL Ratio: 2.3 ratio units (ref 0.0–5.0)
HDL: 76 mg/dL (ref >39)
LDL Calculated: 86 mg/dL (ref 0–99)
Triglycerides: 59 mg/dL (ref 0–149)
VLDL CHOLESTEROL CAL: 12 mg/dL (ref 5–40)

## 2016-09-13 LAB — PSA: Prostate Specific Ag, Serum: 0.5 ng/mL (ref 0.0–4.0)

## 2016-09-15 ENCOUNTER — Other Ambulatory Visit: Payer: Self-pay | Admitting: Family Medicine

## 2016-09-15 DIAGNOSIS — F32A Depression, unspecified: Secondary | ICD-10-CM

## 2016-09-15 DIAGNOSIS — F329 Major depressive disorder, single episode, unspecified: Secondary | ICD-10-CM

## 2016-09-15 DIAGNOSIS — F419 Anxiety disorder, unspecified: Principal | ICD-10-CM

## 2017-03-19 ENCOUNTER — Other Ambulatory Visit: Payer: Self-pay | Admitting: Family Medicine

## 2017-03-19 DIAGNOSIS — I10 Essential (primary) hypertension: Secondary | ICD-10-CM

## 2017-03-28 DIAGNOSIS — Z86010 Personal history of colonic polyps: Secondary | ICD-10-CM | POA: Diagnosis not present

## 2017-03-28 DIAGNOSIS — D126 Benign neoplasm of colon, unspecified: Secondary | ICD-10-CM | POA: Diagnosis not present

## 2017-03-28 DIAGNOSIS — D122 Benign neoplasm of ascending colon: Secondary | ICD-10-CM | POA: Diagnosis not present

## 2017-03-28 DIAGNOSIS — K5730 Diverticulosis of large intestine without perforation or abscess without bleeding: Secondary | ICD-10-CM | POA: Diagnosis not present

## 2017-03-28 DIAGNOSIS — K635 Polyp of colon: Secondary | ICD-10-CM | POA: Diagnosis not present

## 2017-03-28 DIAGNOSIS — K648 Other hemorrhoids: Secondary | ICD-10-CM | POA: Diagnosis not present

## 2017-04-15 ENCOUNTER — Other Ambulatory Visit: Payer: Self-pay | Admitting: Family Medicine

## 2017-04-15 DIAGNOSIS — I10 Essential (primary) hypertension: Secondary | ICD-10-CM

## 2017-04-22 ENCOUNTER — Encounter: Payer: Self-pay | Admitting: Family Medicine

## 2017-04-22 ENCOUNTER — Ambulatory Visit (INDEPENDENT_AMBULATORY_CARE_PROVIDER_SITE_OTHER): Payer: BLUE CROSS/BLUE SHIELD | Admitting: Family Medicine

## 2017-04-22 VITALS — BP 138/84 | HR 60 | Ht 72.0 in | Wt 216.0 lb

## 2017-04-22 DIAGNOSIS — N451 Epididymitis: Secondary | ICD-10-CM

## 2017-04-22 DIAGNOSIS — F419 Anxiety disorder, unspecified: Secondary | ICD-10-CM

## 2017-04-22 DIAGNOSIS — I10 Essential (primary) hypertension: Secondary | ICD-10-CM

## 2017-04-22 DIAGNOSIS — F329 Major depressive disorder, single episode, unspecified: Secondary | ICD-10-CM | POA: Diagnosis not present

## 2017-04-22 DIAGNOSIS — N410 Acute prostatitis: Secondary | ICD-10-CM

## 2017-04-22 DIAGNOSIS — E782 Mixed hyperlipidemia: Secondary | ICD-10-CM | POA: Diagnosis not present

## 2017-04-22 DIAGNOSIS — Z23 Encounter for immunization: Secondary | ICD-10-CM | POA: Diagnosis not present

## 2017-04-22 DIAGNOSIS — R69 Illness, unspecified: Secondary | ICD-10-CM | POA: Diagnosis not present

## 2017-04-22 DIAGNOSIS — S39012A Strain of muscle, fascia and tendon of lower back, initial encounter: Secondary | ICD-10-CM

## 2017-04-22 LAB — POCT URINALYSIS DIPSTICK
Bilirubin, UA: NEGATIVE
GLUCOSE UA: NEGATIVE
Ketones, UA: NEGATIVE
NITRITE UA: NEGATIVE
PROTEIN UA: NEGATIVE
Spec Grav, UA: 1.015 (ref 1.010–1.025)
UROBILINOGEN UA: 0.2 U/dL
pH, UA: 6 (ref 5.0–8.0)

## 2017-04-22 MED ORDER — ATORVASTATIN CALCIUM 40 MG PO TABS: 40.0000 mg | ORAL_TABLET | Freq: Every day | ORAL | 1 refills | Status: DC

## 2017-04-22 MED ORDER — BUPROPION HCL ER (SR) 100 MG PO TB12: 100.0000 mg | ORAL_TABLET | Freq: Two times a day (BID) | ORAL | 1 refills | Status: DC

## 2017-04-22 MED ORDER — DOXYCYCLINE HYCLATE 100 MG PO TABS: 100.0000 mg | ORAL_TABLET | Freq: Two times a day (BID) | ORAL | 0 refills | Status: DC

## 2017-04-22 MED ORDER — LOSARTAN POTASSIUM 100 MG PO TABS: 100.0000 mg | ORAL_TABLET | Freq: Every day | ORAL | 1 refills | Status: DC

## 2017-04-22 MED ORDER — PAROXETINE HCL 20 MG PO TABS: 20.0000 mg | ORAL_TABLET | Freq: Every day | ORAL | 1 refills | Status: DC

## 2017-04-22 NOTE — Progress Notes (Signed)
Name: Dennis Navarro   MRN: [id number]    DOB: 1950-09-16   Date:04/22/2017       Progress Note  Subjective  Chief Complaint  Chief Complaint  Patient presents with  . Urinary Tract Infection    frequent urination, lower back pain and testicular soreness when urinating  . Hypertension  . Hyperlipidemia  . Anxiety    takes paxil     Urinary Tract Infection   This is a new problem. The current episode started in the past 7 days. The problem occurs intermittently. The problem has been waxing and waning. The quality of the pain is described as aching and burning. The pain is at a severity of 3/10. The pain is mild. There has been no fever. He is sexually active. There is no history of pyelonephritis. Associated symptoms include frequency. Pertinent negatives include no chills, discharge, flank pain, hematuria, hesitancy, nausea, sweats or urgency. He has tried increased fluids for the symptoms. The treatment provided no relief. There is no history of kidney stones, recurrent UTIs or urinary stasis.  Hypertension  This is a chronic problem. The problem has been gradually improving since onset. The problem is controlled. Associated symptoms include anxiety. Pertinent negatives include no blurred vision, chest pain, headaches, malaise/fatigue, neck pain, orthopnea, palpitations, peripheral edema, PND, shortness of breath or sweats. There are no associated agents to hypertension. Past treatments include angiotensin blockers. The current treatment provides moderate improvement. There are no compliance problems.  There is no history of chronic renal disease, a hypertension causing med or renovascular disease.  Hyperlipidemia  This is a chronic problem. The current episode started more than 1 year ago. The problem is controlled. Recent lipid tests were reviewed and are normal. He has no history of chronic renal disease, diabetes, hypothyroidism, liver disease, obesity or nephrotic syndrome. Factors  aggravating his hyperlipidemia include fatty foods and thiazides. Pertinent negatives include no chest pain, focal weakness, myalgias or shortness of breath. The current treatment provides mild improvement of lipids. There are no compliance problems.  Risk factors for coronary artery disease include dyslipidemia, hypertension and post-menopausal.  Anxiety  Presents for follow-up visit. Patient reports no chest pain, depressed mood, dizziness, excessive worry, insomnia, irritability, nausea, nervous/anxious behavior, palpitations, shortness of breath or suicidal ideas. The severity of symptoms is mild.      No problem-specific Assessment & Plan notes found for this encounter.   Past Medical History:  Diagnosis Date  . Anxiety   . Depression   . Hyperlipidemia   . Hypertension     Past Surgical History:  Procedure Laterality Date  . APPENDECTOMY    . COLONOSCOPY  2013   cleared for 5 yrs- Dr Vira Agar  . HAND SURGERY Bilateral   . HERNIA REPAIR     x 3  . SEPTOPLASTY    . SKIN CANCER EXCISION     under tongue    No family history on file.  Social History   Social History  . Marital status: Married    Spouse name: N/A  . Number of children: N/A  . Years of education: N/A   Occupational History  . Not on file.   Social History Main Topics  . Smoking status: Former Research scientist (life sciences)  . Smokeless tobacco: Never Used  . Alcohol use Yes  . Drug use: Unknown  . Sexual activity: Yes   Other Topics Concern  . Not on file   Social History Narrative  . No narrative on file    No  Known Allergies  Outpatient Medications Prior to Visit  Medication Sig Dispense Refill  . aspirin EC 81 MG tablet Take 81 mg by mouth daily.    Marland Kitchen docusate sodium (COLACE) 100 MG capsule Take 100 mg by mouth as needed. otc    . Multiple Vitamin (MULTI-VITAMINS) TABS Take 1 tablet by mouth daily.    Marland Kitchen atorvastatin (LIPITOR) 40 MG tablet Take 1 tablet (40 mg total) by mouth daily. 90 tablet 2  . buPROPion  (WELLBUTRIN SR) 100 MG 12 hr tablet Take 1 tablet (100 mg total) by mouth 2 (two) times daily. 180 tablet 2  . losartan (COZAAR) 100 MG tablet TAKE 1 TABLET BY MOUTH EVERY DAY 30 tablet 0  . PARoxetine (PAXIL) 20 MG tablet Take 1 tablet (20 mg total) by mouth daily. 90 tablet 2  . amoxicillin (AMOXIL) 500 MG capsule Take 1 capsule (500 mg total) by mouth 3 (three) times daily. 30 capsule 0  . PARoxetine (PAXIL) 20 MG tablet TAKE 1 TABLET (20 MG TOTAL) BY MOUTH DAILY. 30 tablet 5   No facility-administered medications prior to visit.     Review of Systems  Constitutional: Negative for chills, fever, irritability, malaise/fatigue and weight loss.  HENT: Negative for ear discharge, ear pain and sore throat.   Eyes: Negative for blurred vision.  Respiratory: Negative for cough, sputum production, shortness of breath and wheezing.   Cardiovascular: Negative for chest pain, palpitations, orthopnea, leg swelling and PND.  Gastrointestinal: Negative for abdominal pain, blood in stool, constipation, diarrhea, heartburn, melena and nausea.  Genitourinary: Positive for frequency. Negative for dysuria, flank pain, hematuria, hesitancy and urgency.  Musculoskeletal: Negative for back pain, joint pain, myalgias and neck pain.  Skin: Negative for rash.  Neurological: Negative for dizziness, tingling, sensory change, focal weakness and headaches.  Endo/Heme/Allergies: Negative for environmental allergies and polydipsia. Does not bruise/bleed easily.  Psychiatric/Behavioral: Negative for depression and suicidal ideas. The patient is not nervous/anxious and does not have insomnia.      Objective  Vitals:   04/22/17 0940  BP: 138/84  Pulse: 60  Weight: 216 lb (98 kg)  Height: 6' (1.829 m)    Physical Exam  Constitutional: He is oriented to person, place, and time and well-developed, well-nourished, and in no distress.  HENT:  Head: Normocephalic.  Right Ear: External ear normal.  Left Ear:  External ear normal.  Nose: Nose normal.  Mouth/Throat: Oropharynx is clear and moist.  Eyes: Pupils are equal, round, and reactive to light. Conjunctivae and EOM are normal. Right eye exhibits no discharge. Left eye exhibits no discharge. No scleral icterus.  Neck: Normal range of motion. Neck supple. No JVD present. No tracheal deviation present. No thyromegaly present.  Cardiovascular: Normal rate, regular rhythm, normal heart sounds and intact distal pulses.  Exam reveals no gallop and no friction rub.   No murmur heard. Pulmonary/Chest: Breath sounds normal. No respiratory distress. He has no wheezes. He has no rales.  Abdominal: Soft. Bowel sounds are normal. He exhibits no mass. There is no hepatosplenomegaly. There is no tenderness. There is no rebound, no guarding and no CVA tenderness.  Genitourinary: Rectum normal and penis normal. Prostate is tender. Prostate is not enlarged. He exhibits epididymal tenderness. He exhibits no abnormal testicular mass, no testicular tenderness, no abnormal scrotal mass and no scrotal tenderness.  Musculoskeletal: Normal range of motion. He exhibits no edema or tenderness.  Lymphadenopathy:    He has no cervical adenopathy.  Neurological: He is alert and oriented  to person, place, and time. He has normal sensation, normal strength, normal reflexes and intact cranial nerves. No cranial nerve deficit.  Skin: Skin is warm. No rash noted.  Psychiatric: Mood and affect normal.  Nursing note and vitals reviewed.     Assessment & Plan  Problem List Items Addressed This Visit      Cardiovascular and Mediastinum   Essential hypertension - Primary   Relevant Medications   atorvastatin (LIPITOR) 40 MG tablet   losartan (COZAAR) 100 MG tablet   Other Relevant Orders   Renal Function Panel     Other   Anxiety and depression   Relevant Medications   buPROPion (WELLBUTRIN SR) 100 MG 12 hr tablet   PARoxetine (PAXIL) 20 MG tablet   Mixed hyperlipidemia    Relevant Medications   atorvastatin (LIPITOR) 40 MG tablet   losartan (COZAAR) 100 MG tablet   Other Relevant Orders   Lipid Profile    Other Visit Diagnoses    Epididymitis       Relevant Medications   doxycycline (VIBRA-TABS) 100 MG tablet   Other Relevant Orders   POCT urinalysis dipstick (Completed)   Acute prostatitis       Relevant Medications   doxycycline (VIBRA-TABS) 100 MG tablet   Other Relevant Orders   POCT urinalysis dipstick (Completed)   Taking medication for chronic disease       Relevant Orders   Hepatic function panel   Influenza vaccine needed       Relevant Orders   Flu Vaccine QUAD 6+ mos PF IM (Fluarix Quad PF) (Completed)   Lumbar strain, initial encounter       tylenol      Meds ordered this encounter  Medications  . atorvastatin (LIPITOR) 40 MG tablet    Sig: Take 1 tablet (40 mg total) by mouth daily.    Dispense:  90 tablet    Refill:  1  . buPROPion (WELLBUTRIN SR) 100 MG 12 hr tablet    Sig: Take 1 tablet (100 mg total) by mouth 2 (two) times daily.    Dispense:  180 tablet    Refill:  1    Time for 6 month follow up  . losartan (COZAAR) 100 MG tablet    Sig: Take 1 tablet (100 mg total) by mouth daily.    Dispense:  90 tablet    Refill:  1  . PARoxetine (PAXIL) 20 MG tablet    Sig: Take 1 tablet (20 mg total) by mouth daily.    Dispense:  90 tablet    Refill:  1  . doxycycline (VIBRA-TABS) 100 MG tablet    Sig: Take 1 tablet (100 mg total) by mouth 2 (two) times daily.    Dispense:  30 tablet    Refill:  0  leuks were normal/ negative not a trace of leuks- entered in error on poct urinalysis    Dr. Otilio Miu [facility]  04/22/17

## 2017-04-23 LAB — HEPATIC FUNCTION PANEL
ALT: 28 IU/L (ref 0–44)
AST: 25 IU/L (ref 0–40)
Alkaline Phosphatase: 43 IU/L (ref 39–117)
Bilirubin Total: 0.3 mg/dL (ref 0.0–1.2)
Bilirubin, Direct: 0.1 mg/dL (ref 0.00–0.40)
TOTAL PROTEIN: 6.6 g/dL (ref 6.0–8.5)

## 2017-04-23 LAB — LIPID PANEL
CHOL/HDL RATIO: 2 ratio (ref 0.0–5.0)
CHOLESTEROL TOTAL: 158 mg/dL (ref 100–199)
HDL: 78 mg/dL (ref >39)
LDL Calculated: 67 mg/dL (ref 0–99)
TRIGLYCERIDES: 63 mg/dL (ref 0–149)
VLDL Cholesterol Cal: 13 mg/dL (ref 5–40)

## 2017-04-23 LAB — RENAL FUNCTION PANEL
Albumin: 4.2 g/dL (ref 3.6–4.8)
BUN / CREAT RATIO: 21 (ref 10–24)
BUN: 17 mg/dL (ref 8–27)
CALCIUM: 9.3 mg/dL (ref 8.6–10.2)
CHLORIDE: 103 mmol/L (ref 96–106)
CO2: 25 mmol/L (ref 20–29)
CREATININE: 0.8 mg/dL (ref 0.76–1.27)
GFR calc non Af Amer: 94 mL/min/{1.73_m2} (ref >59)
GFR, EST AFRICAN AMERICAN: 108 mL/min/{1.73_m2} (ref >59)
Glucose: 118 mg/dL — ABNORMAL HIGH (ref 65–99)
Phosphorus: 2.6 mg/dL (ref 2.5–4.5)
Potassium: 4.3 mmol/L (ref 3.5–5.2)
SODIUM: 142 mmol/L (ref 134–144)

## 2017-04-29 ENCOUNTER — Other Ambulatory Visit: Payer: Self-pay

## 2017-05-03 ENCOUNTER — Other Ambulatory Visit: Payer: Self-pay | Admitting: Family Medicine

## 2017-05-03 DIAGNOSIS — N410 Acute prostatitis: Secondary | ICD-10-CM

## 2017-05-03 DIAGNOSIS — N451 Epididymitis: Secondary | ICD-10-CM

## 2017-05-06 ENCOUNTER — Ambulatory Visit
Admission: RE | Admit: 2017-05-06 | Discharge: 2017-05-06 | Disposition: A | Discharge status: Home / Self Care | Payer: BLUE CROSS/BLUE SHIELD | Source: Ambulatory Visit | Attending: Family Medicine | Admitting: Family Medicine

## 2017-05-06 ENCOUNTER — Ambulatory Visit (INDEPENDENT_AMBULATORY_CARE_PROVIDER_SITE_OTHER): Payer: BLUE CROSS/BLUE SHIELD | Admitting: Family Medicine

## 2017-05-06 ENCOUNTER — Encounter: Payer: Self-pay | Admitting: Family Medicine

## 2017-05-06 VITALS — BP 120/80 | HR 72 | Temp 99.0°F | Ht 72.0 in | Wt 215.0 lb

## 2017-05-06 DIAGNOSIS — R5381 Other malaise: Secondary | ICD-10-CM

## 2017-05-06 DIAGNOSIS — M47817 Spondylosis without myelopathy or radiculopathy, lumbosacral region: Secondary | ICD-10-CM | POA: Diagnosis not present

## 2017-05-06 DIAGNOSIS — M5136 Other intervertebral disc degeneration, lumbar region: Secondary | ICD-10-CM | POA: Insufficient documentation

## 2017-05-06 DIAGNOSIS — R5383 Other fatigue: Secondary | ICD-10-CM

## 2017-05-06 DIAGNOSIS — M545 Low back pain, unspecified: Secondary | ICD-10-CM

## 2017-05-06 DIAGNOSIS — N401 Enlarged prostate with lower urinary tract symptoms: Secondary | ICD-10-CM

## 2017-05-06 DIAGNOSIS — N419 Inflammatory disease of prostate, unspecified: Secondary | ICD-10-CM | POA: Diagnosis not present

## 2017-05-06 LAB — POCT URINALYSIS DIPSTICK
BILIRUBIN UA: NEGATIVE
Glucose, UA: NEGATIVE
KETONES UA: NEGATIVE
Leukocytes, UA: NEGATIVE
Nitrite, UA: NEGATIVE
PH UA: 5 (ref 5.0–8.0)
Protein, UA: NEGATIVE
RBC UA: NEGATIVE
Spec Grav, UA: 1.01 (ref 1.010–1.025)
Urobilinogen, UA: 0.2 E.U./dL

## 2017-05-06 MED ORDER — CIPROFLOXACIN HCL 500 MG PO TABS: 500.0000 mg | ORAL_TABLET | Freq: Two times a day (BID) | ORAL | 0 refills | Status: DC

## 2017-05-06 NOTE — Progress Notes (Signed)
Name: Dennis Navarro   MRN: [id number]    DOB: 12-01-1950   Date:05/06/2017       Progress Note  Subjective  Chief Complaint  Chief Complaint  Patient presents with  . Follow-up    prostatitis- finished round of Doxy today- still having some soreness of testicles, lower back pain, no fever    Urinary Frequency   This is a new problem. The current episode started 1 to 4 weeks ago. The problem occurs intermittently. The problem has been waxing and waning. The quality of the pain is described as burning (dysuria). The pain is mild. There has been no fever. He is sexually active. Associated symptoms include frequency. Pertinent negatives include no chills, discharge, flank pain, hematuria, hesitancy, nausea, sweats, urgency or vomiting. He has tried acetaminophen for the symptoms. The treatment provided no relief.  Back Pain  This is a new problem. The current episode started 1 to 4 weeks ago. The problem occurs daily. The problem has been gradually improving since onset. The pain is present in the sacro-iliac and lumbar spine. The quality of the pain is described as aching. The pain does not radiate. The pain is at a severity of 1/10. The pain is mild. The symptoms are aggravated by bending. Associated symptoms include dysuria. Pertinent negatives include no abdominal pain, bladder incontinence, bowel incontinence, chest pain, fever, headaches, leg pain, numbness, paresis, paresthesias, tingling, weakness or weight loss. Treatments tried: antibiotics.  Thyroid Problem  Presents for follow-up visit. Symptoms include diarrhea and fatigue. Patient reports no anxiety, cold intolerance, constipation, diaphoresis, dry skin, hair loss, heat intolerance, leg swelling, palpitations, visual change, weight gain or weight loss.    No problem-specific Assessment & Plan notes found for this encounter.   Past Medical History:  Diagnosis Date  . Anxiety   . Depression   . Hyperlipidemia   . Hypertension      Past Surgical History:  Procedure Laterality Date  . APPENDECTOMY    . COLONOSCOPY  2013   cleared for 5 yrs- Dr Vira Agar  . HAND SURGERY Bilateral   . HERNIA REPAIR     x 3  . SEPTOPLASTY    . SKIN CANCER EXCISION     under tongue    No family history on file.  Social History   Social History  . Marital status: Married    Spouse name: N/A  . Number of children: N/A  . Years of education: N/A   Occupational History  . Not on file.   Social History Main Topics  . Smoking status: Former Research scientist (life sciences)  . Smokeless tobacco: Never Used  . Alcohol use Yes  . Drug use: Unknown  . Sexual activity: Yes   Other Topics Concern  . Not on file   Social History Narrative  . No narrative on file    No Known Allergies  Outpatient Medications Prior to Visit  Medication Sig Dispense Refill  . aspirin EC 81 MG tablet Take 81 mg by mouth daily.    Marland Kitchen atorvastatin (LIPITOR) 40 MG tablet Take 1 tablet (40 mg total) by mouth daily. 90 tablet 1  . buPROPion (WELLBUTRIN SR) 100 MG 12 hr tablet Take 1 tablet (100 mg total) by mouth 2 (two) times daily. 180 tablet 1  . docusate sodium (COLACE) 100 MG capsule Take 100 mg by mouth as needed. otc    . losartan (COZAAR) 100 MG tablet Take 1 tablet (100 mg total) by mouth daily. 90 tablet 1  . Multiple  Vitamin (MULTI-VITAMINS) TABS Take 1 tablet by mouth daily.    Marland Kitchen PARoxetine (PAXIL) 20 MG tablet Take 1 tablet (20 mg total) by mouth daily. 90 tablet 1  . doxycycline (VIBRA-TABS) 100 MG tablet Take 1 tablet (100 mg total) by mouth 2 (two) times daily. 30 tablet 0   No facility-administered medications prior to visit.     Review of Systems  Constitutional: Positive for fatigue. Negative for chills, diaphoresis, fever, malaise/fatigue, weight gain and weight loss.  HENT: Negative for ear discharge, ear pain and sore throat.   Eyes: Negative for blurred vision.  Respiratory: Negative for cough, sputum production, shortness of breath and  wheezing.   Cardiovascular: Negative for chest pain, palpitations and leg swelling.  Gastrointestinal: Positive for diarrhea. Negative for abdominal pain, blood in stool, bowel incontinence, constipation, heartburn, melena, nausea and vomiting.  Genitourinary: Positive for dysuria and frequency. Negative for bladder incontinence, flank pain, hematuria, hesitancy and urgency.  Musculoskeletal: Positive for back pain. Negative for joint pain, myalgias and neck pain.  Skin: Negative for rash.  Neurological: Negative for dizziness, tingling, sensory change, focal weakness, weakness, numbness, headaches and paresthesias.  Endo/Heme/Allergies: Negative for environmental allergies, cold intolerance, heat intolerance and polydipsia. Does not bruise/bleed easily.  Psychiatric/Behavioral: Negative for depression and suicidal ideas. The patient is not nervous/anxious and does not have insomnia.      Objective  Vitals:   05/06/17 1040  BP: 120/80  Pulse: 72  Temp: 99 F (37.2 C)  TempSrc: Oral  Weight: 215 lb (97.5 kg)  Height: 6' (1.829 m)    Physical Exam  Constitutional: He is oriented to person, place, and time and well-developed, well-nourished, and in no distress.  HENT:  Head: Normocephalic.  Right Ear: External ear normal.  Left Ear: External ear normal.  Nose: Nose normal.  Mouth/Throat: Oropharynx is clear and moist.  Eyes: Pupils are equal, round, and reactive to light. Conjunctivae and EOM are normal. Right eye exhibits no discharge. Left eye exhibits no discharge. No scleral icterus.  Neck: Normal range of motion. Neck supple. No JVD present. No tracheal deviation present. No thyromegaly present.  Cardiovascular: Normal rate, regular rhythm, normal heart sounds and intact distal pulses.  Exam reveals no gallop and no friction rub.   No murmur heard. Pulmonary/Chest: Breath sounds normal. No respiratory distress. He has no wheezes. He has no rales. He exhibits no tenderness.   Abdominal: Soft. Bowel sounds are normal. He exhibits no mass. There is no hepatosplenomegaly. There is no tenderness. There is no rebound, no guarding and no CVA tenderness.  Genitourinary: Rectum normal. Prostate is enlarged and tender. He exhibits testicular tenderness and scrotal tenderness. He exhibits no abnormal testicular mass and no abnormal scrotal mass.  Musculoskeletal: Normal range of motion. He exhibits no edema or tenderness.       Lumbar back: He exhibits spasm.  Lymphadenopathy:    He has no cervical adenopathy.  Neurological: He is alert and oriented to person, place, and time. He has normal sensation, normal strength, normal reflexes and intact cranial nerves. No cranial nerve deficit.  Skin: Skin is warm. No rash noted.  Psychiatric: Mood and affect normal.  Nursing note and vitals reviewed.     Assessment & Plan  Problem List Items Addressed This Visit    None    Visit Diagnoses    Prostatitis, unspecified prostatitis type    -  Primary   Relevant Medications   ciprofloxacin (CIPRO) 500 MG tablet   Other Relevant Orders  CBC with Differential/Platelet   PSA   Ambulatory referral to Urology   POCT Urinalysis Dipstick (Completed)   Acute bilateral low back pain without sciatica       Relevant Orders   PSA   DG Lumbar Spine Complete   Malaise and fatigue       Relevant Orders   TSH   Benign prostatic hyperplasia with lower urinary tract symptoms, symptom details unspecified       Relevant Orders   Ambulatory referral to Urology      Meds ordered this encounter  Medications  . ciprofloxacin (CIPRO) 500 MG tablet    Sig: Take 1 tablet (500 mg total) by mouth 2 (two) times daily.    Dispense:  56 tablet    Refill:  0      Dr. Otilio Miu [facility]  05/06/17

## 2017-05-07 LAB — CBC WITH DIFFERENTIAL/PLATELET
BASOS ABS: 0 10*3/uL (ref 0.0–0.2)
Basos: 1 %
EOS (ABSOLUTE): 0.2 10*3/uL (ref 0.0–0.4)
Eos: 3 %
Hematocrit: 41.9 % (ref 37.5–51.0)
Hemoglobin: 14.1 g/dL (ref 13.0–17.7)
IMMATURE GRANS (ABS): 0 10*3/uL (ref 0.0–0.1)
Immature Granulocytes: 0 %
LYMPHS: 27 %
Lymphocytes Absolute: 1.9 10*3/uL (ref 0.7–3.1)
MCH: 30.8 pg (ref 26.6–33.0)
MCHC: 33.7 g/dL (ref 31.5–35.7)
MCV: 92 fL (ref 79–97)
MONOCYTES: 11 %
Monocytes Absolute: 0.8 10*3/uL (ref 0.1–0.9)
NEUTROS ABS: 4.2 10*3/uL (ref 1.4–7.0)
Neutrophils: 58 %
PLATELETS: 262 10*3/uL (ref 150–379)
RBC: 4.58 x10E6/uL (ref 4.14–5.80)
RDW: 13.5 % (ref 12.3–15.4)
WBC: 7.2 10*3/uL (ref 3.4–10.8)

## 2017-05-07 LAB — PSA: PROSTATE SPECIFIC AG, SERUM: 0.5 ng/mL (ref 0.0–4.0)

## 2017-05-07 LAB — TSH: TSH: 1.92 u[IU]/mL (ref 0.450–4.500)

## 2017-05-14 ENCOUNTER — Other Ambulatory Visit: Payer: Self-pay | Admitting: Family Medicine

## 2017-05-14 DIAGNOSIS — I10 Essential (primary) hypertension: Secondary | ICD-10-CM

## 2017-05-31 ENCOUNTER — Encounter: Payer: Self-pay | Admitting: Urology

## 2017-05-31 ENCOUNTER — Other Ambulatory Visit: Payer: Self-pay | Admitting: *Deleted

## 2017-05-31 ENCOUNTER — Ambulatory Visit (INDEPENDENT_AMBULATORY_CARE_PROVIDER_SITE_OTHER): Payer: BLUE CROSS/BLUE SHIELD | Admitting: Urology

## 2017-05-31 ENCOUNTER — Other Ambulatory Visit
Admission: RE | Admit: 2017-05-31 | Discharge: 2017-05-31 | Disposition: A | Discharge status: Home / Self Care | Payer: BLUE CROSS/BLUE SHIELD | Source: Ambulatory Visit | Attending: Urology | Admitting: Urology

## 2017-05-31 VITALS — BP 151/79 | HR 66 | Ht 72.0 in | Wt 219.0 lb

## 2017-05-31 DIAGNOSIS — N400 Enlarged prostate without lower urinary tract symptoms: Secondary | ICD-10-CM

## 2017-05-31 DIAGNOSIS — N411 Chronic prostatitis: Secondary | ICD-10-CM | POA: Diagnosis not present

## 2017-05-31 LAB — URINALYSIS, COMPLETE (UACMP) WITH MICROSCOPIC
BACTERIA UA: NONE SEEN
Bilirubin Urine: NEGATIVE
Glucose, UA: NEGATIVE mg/dL
Hgb urine dipstick: NEGATIVE
KETONES UR: NEGATIVE mg/dL
Leukocytes, UA: NEGATIVE
Nitrite: NEGATIVE
PH: 7 (ref 5.0–8.0)
Protein, ur: NEGATIVE mg/dL
RBC / HPF: NONE SEEN RBC/hpf (ref 0–5)
SQUAMOUS EPITHELIAL / LPF: NONE SEEN
Specific Gravity, Urine: 1.02 (ref 1.005–1.030)

## 2017-05-31 NOTE — Progress Notes (Signed)
05/31/2017 8:52 AM   Dennis Navarro 09/10/1950 [id number]  Referring provider: Juline Patch, MD [address] [place], [place] [address]  Chief Complaint  Patient presents with  . New Patient (Initial Visit)    BPH referred by Otilio Miu    HPI: 66 year old male referred for further evaluation of a possible episode of prostatitis.  He reports approximately 6 weeks ago, he developed urinary frequency, pain at the distal aspect of his penis with voiding, testicular sensitivity, pelvic and low back pain.  Additionally, on a rectal exam was performed by his PCP, Dr. Ronnald Ramp, he had exquisite tenderness.  He was treated with initially 2 weeks of antibiotics (doxycycline) and his symptoms fail to resolve.  He was treated for an additional 4 weeks with Cipro which he is completing currently.  He reports that today, he is 95% better.  His back pain is dramatically improved along with all other urinary symptoms.  He does continue to have some mild frequency but otherwise has no complaints.  He does have a remote history of UTI x1.   No history of gross hematuria.  No previous history off prostatitis.  At baseline, he denies any significant voiding symptoms.  He has an excellent stream and feels like he is able to empty his bladder.  No frequency or urgency.  PSA 0.5 on 05/06/17.  Rectal exams performed by Dr. Ronnald Ramp.  PMH: Past Medical History:  Diagnosis Date  . Anxiety   . BPH (benign prostatic hyperplasia)   . Depression   . Hyperlipidemia   . Hypertension   . Skin cancer   . Sleep apnea     Surgical History: Past Surgical History:  Procedure Laterality Date  . APPENDECTOMY    . COLONOSCOPY  2013   cleared for 5 yrs- Dr Vira Agar  . HAND SURGERY Bilateral   . HERNIA REPAIR     x 3  . SEPTOPLASTY    . SKIN CANCER EXCISION     under tongue  . squamous cell cancer removal from chest      Home Medications:  Allergies as of 05/31/2017   No Known Allergies     Medication List       Accurate as of 05/31/17 11:59 PM. Always use your most recent med list.          aspirin EC 81 MG tablet Take 81 mg by mouth daily.   atorvastatin 40 MG tablet Commonly known as:  LIPITOR Take 1 tablet (40 mg total) by mouth daily.   buPROPion 100 MG 12 hr tablet Commonly known as:  WELLBUTRIN SR Take 1 tablet (100 mg total) by mouth 2 (two) times daily.   ciprofloxacin 500 MG tablet Commonly known as:  CIPRO Take 1 tablet (500 mg total) by mouth 2 (two) times daily.   docusate sodium 100 MG capsule Commonly known as:  COLACE Take 100 mg by mouth as needed. otc   losartan 100 MG tablet Commonly known as:  COZAAR Take 1 tablet (100 mg total) by mouth daily.   losartan 100 MG tablet Commonly known as:  COZAAR TAKE 1 TABLET BY MOUTH EVERY DAY ** SCHEDULE APPOINTMENT FOR MEDS REFILL **   MULTI-VITAMINS Tabs Take 1 tablet by mouth daily.   PARoxetine 20 MG tablet Commonly known as:  PAXIL Take 1 tablet (20 mg total) by mouth daily.       Allergies: No Known Allergies  Family History: Family History  Problem Relation Age of Onset  .  Prostate cancer Neg Hx   . Kidney cancer Neg Hx   . Bladder Cancer Neg Hx     Social History:  reports that he has quit smoking. He has never used smokeless tobacco. He reports that he drinks alcohol. He reports that he does not use drugs.  ROS: UROLOGY Frequent Urination?: No Hard to postpone urination?: No Burning/pain with urination?: No Get up at night to urinate?: Yes Leakage of urine?: No Urine stream starts and stops?: No Trouble starting stream?: No Do you have to strain to urinate?: No Blood in urine?: No Urinary tract infection?: No Sexually transmitted disease?: No Injury to kidneys or bladder?: No Painful intercourse?: No Weak stream?: No Erection problems?: No Penile pain?: No  Gastrointestinal Nausea?: No Vomiting?: No Indigestion/heartburn?: No Diarrhea?: No Constipation?:  No  Constitutional Fever: No Night sweats?: No Weight loss?: No Fatigue?: No  Skin Skin rash/lesions?: No Itching?: No  Eyes Blurred vision?: No Double vision?: No  Ears/Nose/Throat Sore throat?: No Sinus problems?: No  Hematologic/Lymphatic Swollen glands?: No Easy bruising?: No  Cardiovascular Leg swelling?: Yes Chest pain?: No  Respiratory Cough?: No Shortness of breath?: No  Endocrine Excessive thirst?: No  Musculoskeletal Back pain?: No Joint pain?: Yes  Neurological Headaches?: No Dizziness?: No  Psychologic Depression?: No Anxiety?: Yes  Physical Exam: BP (!) 151/79   Pulse 66   Ht 6' (1.829 m)   Wt 219 lb (99.3 kg)   BMI 29.70 kg/m   Constitutional:  Alert and oriented, No acute distress. HEENT: [place] AT, moist mucus membranes.  Trachea midline, no masses. Cardiovascular: No clubbing, cyanosis, or edema. Respiratory: Normal respiratory effort, no increased work of breathing. GI: Abdomen is soft, nontender, nondistended, no abdominal masses GU: No CVA tenderness.  Stool: Deferred today as symptoms are not nearly resolved, performed last month by Dr. Ronnald Ramp Skin: No rashes, bruises or suspicious lesions. Neurologic: Grossly intact, no focal deficits, moving all 4 extremities. Psychiatric: Normal mood and affect.  Laboratory Data: Lab Results  Component Value Date   WBC 7.2 05/06/2017   HGB 14.1 05/06/2017   HCT 41.9 05/06/2017   MCV 92 05/06/2017   PLT 262 05/06/2017    Lab Results  Component Value Date   CREATININE 0.80 04/22/2017    Lab Results  Component Value Date   PSA1 0.5 05/06/2017   PSA1 0.5 09/12/2016    Urinalysis Results for orders placed or performed during the hospital encounter of 05/31/17  Urinalysis, Complete w Microscopic  Result Value Ref Range   Color, Urine YELLOW YELLOW   APPearance CLEAR CLEAR   Specific Gravity, Urine 1.020 1.005 - 1.030   pH 7.0 5.0 - 8.0   Glucose, UA NEGATIVE NEGATIVE mg/dL    Hgb urine dipstick NEGATIVE NEGATIVE   Bilirubin Urine NEGATIVE NEGATIVE   Ketones, ur NEGATIVE NEGATIVE mg/dL   Protein, ur NEGATIVE NEGATIVE mg/dL   Nitrite NEGATIVE NEGATIVE   Leukocytes, UA NEGATIVE NEGATIVE   Squamous Epithelial / LPF NONE SEEN NONE SEEN   WBC, UA 0-5 0 - 5 WBC/hpf   RBC / HPF NONE SEEN 0 - 5 RBC/hpf   Bacteria, UA NONE SEEN NONE SEEN   Amorphous Crystal PRESENT     Pertinent Imaging: N/a  Assessment & Plan:    1. Chronic prostatitis Recent episode of prostatitis, symptoms and exam seem to be consistent with this. Responded well to prolonged course of Cipro-complete resolution of symptoms today Dissipate continued improvement We discussed the natural history of prostatitis, usually not bacterial  rather inflammatory Advised to take NSAIDs, supportive care and call our office if he experiences any recurrent symptoms  F/u prn  Hollice Espy, MD  [place] [address] [place], [place] [address] [phone]

## 2017-07-09 DIAGNOSIS — L570 Actinic keratosis: Secondary | ICD-10-CM | POA: Diagnosis not present

## 2017-07-09 DIAGNOSIS — Z86018 Personal history of other benign neoplasm: Secondary | ICD-10-CM | POA: Diagnosis not present

## 2017-07-09 DIAGNOSIS — L578 Other skin changes due to chronic exposure to nonionizing radiation: Secondary | ICD-10-CM | POA: Diagnosis not present

## 2017-07-09 DIAGNOSIS — Z859 Personal history of malignant neoplasm, unspecified: Secondary | ICD-10-CM | POA: Diagnosis not present

## 2017-11-02 ENCOUNTER — Other Ambulatory Visit: Payer: Self-pay | Admitting: Family Medicine

## 2017-11-02 DIAGNOSIS — F32A Depression, unspecified: Secondary | ICD-10-CM

## 2017-11-02 DIAGNOSIS — F329 Major depressive disorder, single episode, unspecified: Secondary | ICD-10-CM

## 2017-11-02 DIAGNOSIS — F419 Anxiety disorder, unspecified: Principal | ICD-10-CM

## 2017-12-01 ENCOUNTER — Other Ambulatory Visit: Payer: Self-pay | Admitting: Family Medicine

## 2017-12-01 DIAGNOSIS — F419 Anxiety disorder, unspecified: Secondary | ICD-10-CM

## 2017-12-01 DIAGNOSIS — E782 Mixed hyperlipidemia: Secondary | ICD-10-CM

## 2017-12-01 DIAGNOSIS — F329 Major depressive disorder, single episode, unspecified: Secondary | ICD-10-CM

## 2017-12-02 ENCOUNTER — Other Ambulatory Visit: Payer: Self-pay | Admitting: Family Medicine

## 2017-12-02 DIAGNOSIS — I10 Essential (primary) hypertension: Secondary | ICD-10-CM

## 2017-12-29 ENCOUNTER — Other Ambulatory Visit: Payer: Self-pay | Admitting: Family Medicine

## 2017-12-29 DIAGNOSIS — I10 Essential (primary) hypertension: Secondary | ICD-10-CM

## 2017-12-29 DIAGNOSIS — E782 Mixed hyperlipidemia: Secondary | ICD-10-CM

## 2018-01-08 ENCOUNTER — Encounter: Payer: Self-pay | Admitting: Family Medicine

## 2018-01-08 ENCOUNTER — Ambulatory Visit (INDEPENDENT_AMBULATORY_CARE_PROVIDER_SITE_OTHER): Payer: BLUE CROSS/BLUE SHIELD | Admitting: Family Medicine

## 2018-01-08 VITALS — BP 138/80 | HR 80 | Ht 72.0 in | Wt 218.0 lb

## 2018-01-08 DIAGNOSIS — Z23 Encounter for immunization: Secondary | ICD-10-CM

## 2018-01-08 DIAGNOSIS — R350 Frequency of micturition: Secondary | ICD-10-CM | POA: Diagnosis not present

## 2018-01-08 DIAGNOSIS — F419 Anxiety disorder, unspecified: Secondary | ICD-10-CM

## 2018-01-08 DIAGNOSIS — E782 Mixed hyperlipidemia: Secondary | ICD-10-CM

## 2018-01-08 DIAGNOSIS — M720 Palmar fascial fibromatosis [Dupuytren]: Secondary | ICD-10-CM | POA: Diagnosis not present

## 2018-01-08 DIAGNOSIS — Z1211 Encounter for screening for malignant neoplasm of colon: Secondary | ICD-10-CM

## 2018-01-08 DIAGNOSIS — F329 Major depressive disorder, single episode, unspecified: Secondary | ICD-10-CM

## 2018-01-08 DIAGNOSIS — N401 Enlarged prostate with lower urinary tract symptoms: Secondary | ICD-10-CM

## 2018-01-08 DIAGNOSIS — I10 Essential (primary) hypertension: Secondary | ICD-10-CM

## 2018-01-08 LAB — HEMOCCULT GUIAC POC 1CARD (OFFICE): FECAL OCCULT BLD: NEGATIVE

## 2018-01-08 MED ORDER — BUPROPION HCL ER (SR) 100 MG PO TB12: ORAL_TABLET | ORAL | 1 refills | Status: DC

## 2018-01-08 MED ORDER — ATORVASTATIN CALCIUM 40 MG PO TABS: 40.0000 mg | ORAL_TABLET | Freq: Every day | ORAL | 1 refills | Status: DC

## 2018-01-08 MED ORDER — PAROXETINE HCL 20 MG PO TABS: 20.0000 mg | ORAL_TABLET | Freq: Every day | ORAL | 1 refills | Status: DC

## 2018-01-08 MED ORDER — TAMSULOSIN HCL 0.4 MG PO CAPS: 0.4000 mg | ORAL_CAPSULE | Freq: Every day | ORAL | 3 refills | Status: DC

## 2018-01-08 MED ORDER — LOSARTAN POTASSIUM 100 MG PO TABS: 100.0000 mg | ORAL_TABLET | Freq: Every day | ORAL | 1 refills | Status: DC

## 2018-01-08 NOTE — Assessment & Plan Note (Signed)
No changes in medications at this time. Will check lipid panel and continue atorvastatin

## 2018-01-08 NOTE — Progress Notes (Signed)
Name: Dennis Navarro   MRN: [id number]    DOB: 1951-04-14   Date:01/08/2018       Progress Note  Subjective  Chief Complaint  Chief Complaint  Patient presents with  . Hyperlipidemia  . Hypertension  . Anxiety  . Hand Pain    L) hand- Dupurtren's contracture?- wants referral to Dr Clenton Pare at [facility]    Hyperlipidemia  This is a chronic problem. The current episode started more than 1 year ago. The problem is controlled. Recent lipid tests were reviewed and are normal. He has no history of chronic renal disease, diabetes, hypothyroidism, liver disease, obesity or nephrotic syndrome. There are no known factors aggravating his hyperlipidemia. Pertinent negatives include no chest pain, focal sensory loss, focal weakness, leg pain, myalgias or shortness of breath. Current antihyperlipidemic treatment includes statins (atorvastatin). The current treatment provides moderate improvement of lipids. There are no compliance problems.  Risk factors for coronary artery disease include dyslipidemia, male sex and post-menopausal.  Hypertension  This is a chronic problem. The current episode started more than 1 year ago. The problem is unchanged. The problem is controlled. Associated symptoms include anxiety. Pertinent negatives include no blurred vision, chest pain, headaches, malaise/fatigue, neck pain, orthopnea, palpitations, peripheral edema, PND, shortness of breath or sweats. There are no associated agents to hypertension. Risk factors for coronary artery disease include dyslipidemia and obesity. Past treatments include angiotensin blockers. The current treatment provides moderate improvement. There are no compliance problems.  There is no history of angina, kidney disease, CAD/MI, CVA, heart failure, left ventricular hypertrophy, PVD or retinopathy. There is no history of chronic renal disease, a hypertension causing med or renovascular disease.  Anxiety  Presents for follow-up visit.  Symptoms include excessive worry and nervous/anxious behavior. Patient reports no chest pain, compulsions, confusion, decreased concentration, depressed mood, dizziness, dry mouth, feeling of choking, hyperventilation, impotence, insomnia, irritability, malaise, muscle tension, nausea, obsessions, palpitations, panic, restlessness, shortness of breath or suicidal ideas. Symptoms occur occasionally. The severity of symptoms is mild. Nighttime awakenings: occasional.    Hand Pain   The incident occurred more than 1 week ago (hx of Duypytren). There was no injury mechanism. The pain is present in the left hand. Pertinent negatives include no chest pain, muscle weakness, numbness or tingling. Treatments tried: steroid injection. The treatment provided no relief.  Benign Prostatic Hypertrophy  This is a new problem. The current episode started more than 1 month ago. The problem has been waxing and waning since onset. Irritative symptoms include frequency and nocturia. Irritative symptoms do not include urgency. Obstructive symptoms include dribbling and incomplete emptying. Obstructive symptoms do not include an intermittent stream, a slower stream, straining or a weak stream. Associated symptoms include hesitancy. Pertinent negatives include no chills, dysuria, genital pain, hematuria or nausea. He is sexually active. Treatments tried: otc. The treatment provided mild relief.    Essential hypertension Controlled continue losartin  Mixed hyperlipidemia No changes in medications at this time. Will check lipid panel and continue atorvastatin   Anxiety and depression Stable on paroxetine 20mg  and bupropion 100mg  q 12 hours   Past Medical History:  Diagnosis Date  . Anxiety   . BPH (benign prostatic hyperplasia)   . Depression   . Hyperlipidemia   . Hypertension   . Skin cancer   . Sleep apnea     Past Surgical History:  Procedure Laterality Date  . APPENDECTOMY    . COLONOSCOPY  2013    cleared for 5 yrs- Dr  Elliott  . HAND SURGERY Bilateral   . HERNIA REPAIR     x 3  . SEPTOPLASTY    . SKIN CANCER EXCISION     under tongue  . squamous cell cancer removal from chest      Family History  Problem Relation Age of Onset  . Prostate cancer Neg Hx   . Kidney cancer Neg Hx   . Bladder Cancer Neg Hx     Social History   Socioeconomic History  . Marital status: Married    Spouse name: Not on file  . Number of children: Not on file  . Years of education: Not on file  . Highest education level: Not on file  Occupational History  . Not on file  Social Needs  . Financial resource strain: Not on file  . Food insecurity:    Worry: Not on file    Inability: Not on file  . Transportation needs:    Medical: Not on file    Non-medical: Not on file  Tobacco Use  . Smoking status: Former Research scientist (life sciences)  . Smokeless tobacco: Never Used  . Tobacco comment: quit 40 years  Substance and Sexual Activity  . Alcohol use: Yes  . Drug use: No  . Sexual activity: Yes  Lifestyle  . Physical activity:    Days per week: Not on file    Minutes per session: Not on file  . Stress: Not on file  Relationships  . Social connections:    Talks on phone: Not on file    Gets together: Not on file    Attends religious service: Not on file    Active member of club or organization: Not on file    Attends meetings of clubs or organizations: Not on file    Relationship status: Not on file  . Intimate partner violence:    Fear of current or ex partner: Not on file    Emotionally abused: Not on file    Physically abused: Not on file    Forced sexual activity: Not on file  Other Topics Concern  . Not on file  Social History Narrative  . Not on file    No Known Allergies  Outpatient Medications Prior to Visit  Medication Sig Dispense Refill  . aspirin EC 81 MG tablet Take 81 mg by mouth daily.    . Multiple Vitamin (MULTI-VITAMINS) TABS Take 1 tablet by mouth daily.    Marland Kitchen atorvastatin  (LIPITOR) 40 MG tablet TAKE 1 TABLET BY MOUTH EVERY DAY 15 tablet 0  . buPROPion (WELLBUTRIN SR) 100 MG 12 hr tablet TAKE 1 TABLET BY MOUTH TWICE A DAY *NEEDS APPT FOR REFILLS* 30 tablet 0  . losartan (COZAAR) 100 MG tablet Take 1 tablet (100 mg total) by mouth daily. 90 tablet 1  . PARoxetine (PAXIL) 20 MG tablet Take 1 tablet (20 mg total) by mouth daily. 90 tablet 1  . ciprofloxacin (CIPRO) 500 MG tablet Take 1 tablet (500 mg total) by mouth 2 (two) times daily. (Patient not taking: Reported on 05/31/2017) 56 tablet 0  . docusate sodium (COLACE) 100 MG capsule Take 100 mg by mouth as needed. otc    . losartan (COZAAR) 100 MG tablet TAKE 1 TABLET BY MOUTH EVERY DAY ** SCHEDULE APPOINTMENT FOR MEDS REFILL ** 15 tablet 0   No facility-administered medications prior to visit.     Review of Systems  Constitutional: Negative for chills, fever, irritability, malaise/fatigue and weight loss.  HENT: Negative for ear  discharge, ear pain and sore throat.   Eyes: Negative for blurred vision.  Respiratory: Negative for cough, sputum production, shortness of breath and wheezing.   Cardiovascular: Negative for chest pain, palpitations, orthopnea, leg swelling and PND.  Gastrointestinal: Negative for abdominal pain, blood in stool, constipation, diarrhea, heartburn, melena and nausea.  Genitourinary: Positive for frequency, hesitancy, incomplete emptying and nocturia. Negative for dysuria, hematuria, impotence and urgency.  Musculoskeletal: Negative for back pain, joint pain, myalgias and neck pain.  Skin: Negative for rash.  Neurological: Negative for dizziness, tingling, sensory change, focal weakness, numbness and headaches.  Endo/Heme/Allergies: Negative for environmental allergies and polydipsia. Does not bruise/bleed easily.  Psychiatric/Behavioral: Negative for confusion, decreased concentration, depression and suicidal ideas. The patient is nervous/anxious. The patient does not have insomnia.       Objective  Vitals:   01/08/18 0828  BP: 138/80  Pulse: 80  Weight: 218 lb (98.9 kg)  Height: 6' (1.829 m)    Physical Exam  Constitutional: He is oriented to person, place, and time.  HENT:  Head: Normocephalic.  Right Ear: External ear normal.  Left Ear: External ear normal.  Nose: Nose normal.  Mouth/Throat: Oropharynx is clear and moist.  Eyes: Pupils are equal, round, and reactive to light. Conjunctivae and EOM are normal. Right eye exhibits no discharge. Left eye exhibits no discharge. No scleral icterus.  Neck: Normal range of motion. Neck supple. No JVD present. No tracheal deviation present. No thyromegaly present.  Cardiovascular: Normal rate, regular rhythm, normal heart sounds and intact distal pulses. Exam reveals no gallop and no friction rub.  No murmur heard. Pulmonary/Chest: Breath sounds normal. No respiratory distress. He has no wheezes. He has no rales.  Abdominal: Soft. Bowel sounds are normal. He exhibits no mass. There is no hepatosplenomegaly. There is no tenderness. There is no rebound, no guarding and no CVA tenderness.  Genitourinary: Prostate normal. Rectal exam shows guaiac negative stool. Prostate is not enlarged and not tender.  Musculoskeletal: He exhibits no edema or tenderness.       Left hand: He exhibits decreased range of motion and deformity. He exhibits no tenderness, no bony tenderness, normal capillary refill and no swelling. Normal sensation noted. Normal strength noted.  Lymphadenopathy:    He has no cervical adenopathy.  Neurological: He is alert and oriented to person, place, and time. He has normal strength and normal reflexes. No cranial nerve deficit.  Skin: Skin is warm. No rash noted.  Nursing note and vitals reviewed.     Assessment & Plan  Problem List Items Addressed This Visit      Cardiovascular and Mediastinum   Essential hypertension    Controlled continue losartin      Relevant Medications   losartan  (COZAAR) 100 MG tablet   atorvastatin (LIPITOR) 40 MG tablet   Other Relevant Orders   Renal Function Panel   POCT occult blood stool (Completed)     Other   Mixed hyperlipidemia    No changes in medications at this time. Will check lipid panel and continue atorvastatin       Relevant Medications   losartan (COZAAR) 100 MG tablet   atorvastatin (LIPITOR) 40 MG tablet   Other Relevant Orders   Lipid panel   Anxiety and depression    Stable on paroxetine 20mg  and bupropion 100mg  q 12 hours      Relevant Medications   buPROPion (WELLBUTRIN SR) 100 MG 12 hr tablet   PARoxetine (PAXIL) 20 MG tablet  Other Visit Diagnoses    Dupuytren's contracture    -  Primary   new /Minimal improvement with steroid injection desires consult for possible surgery   Relevant Orders   Ambulatory referral to Orthopedic Surgery   Benign prostatic hyperplasia with urinary frequency       NEW/progressive symptoms despite otc med will trial tamulosin   Relevant Medications   tamsulosin (FLOMAX) 0.4 MG CAPS capsule   Other Relevant Orders   PSA   Colon cancer screening       Relevant Orders   POCT occult blood stool (Completed)   Need for pneumococcal vaccination       Relevant Orders   Pneumococcal polysaccharide vaccine 23-valent greater than or equal to 2yo subcutaneous/IM (Completed)      Meds ordered this encounter  Medications  . buPROPion (WELLBUTRIN SR) 100 MG 12 hr tablet    Sig: One twice a day    Dispense:  180 tablet    Refill:  1    Needs appt  . PARoxetine (PAXIL) 20 MG tablet    Sig: Take 1 tablet (20 mg total) by mouth daily.    Dispense:  90 tablet    Refill:  1  . losartan (COZAAR) 100 MG tablet    Sig: Take 1 tablet (100 mg total) by mouth daily.    Dispense:  90 tablet    Refill:  1  . atorvastatin (LIPITOR) 40 MG tablet    Sig: Take 1 tablet (40 mg total) by mouth daily.    Dispense:  90 tablet    Refill:  1  . tamsulosin (FLOMAX) 0.4 MG CAPS capsule    Sig:  Take 1 capsule (0.4 mg total) by mouth daily.    Dispense:  30 capsule    Refill:  3      Dr. Otilio Miu [facility]  01/08/18

## 2018-01-08 NOTE — Assessment & Plan Note (Signed)
Controlled continue losartin

## 2018-01-08 NOTE — Assessment & Plan Note (Signed)
Stable on paroxetine 20mg  and bupropion 100mg  q 12 hours

## 2018-01-09 LAB — PSA: Prostate Specific Ag, Serum: 0.5 ng/mL (ref 0.0–4.0)

## 2018-01-09 LAB — RENAL FUNCTION PANEL
Albumin: 4.3 g/dL (ref 3.6–4.8)
BUN / CREAT RATIO: 13 (ref 10–24)
BUN: 11 mg/dL (ref 8–27)
CHLORIDE: 103 mmol/L (ref 96–106)
CO2: 24 mmol/L (ref 20–29)
Calcium: 9.6 mg/dL (ref 8.6–10.2)
Creatinine, Ser: 0.87 mg/dL (ref 0.76–1.27)
GFR calc Af Amer: 104 mL/min/{1.73_m2} (ref >59)
GFR calc non Af Amer: 90 mL/min/{1.73_m2} (ref >59)
Glucose: 120 mg/dL — ABNORMAL HIGH (ref 65–99)
Phosphorus: 2.8 mg/dL (ref 2.5–4.5)
Potassium: 4.8 mmol/L (ref 3.5–5.2)
Sodium: 140 mmol/L (ref 134–144)

## 2018-01-09 LAB — LIPID PANEL
CHOLESTEROL TOTAL: 176 mg/dL (ref 100–199)
Chol/HDL Ratio: 2.2 ratio (ref 0.0–5.0)
HDL: 80 mg/dL (ref >39)
LDL CALC: 77 mg/dL (ref 0–99)
Triglycerides: 93 mg/dL (ref 0–149)
VLDL Cholesterol Cal: 19 mg/dL (ref 5–40)

## 2018-01-22 DIAGNOSIS — M720 Palmar fascial fibromatosis [Dupuytren]: Secondary | ICD-10-CM | POA: Diagnosis not present

## 2018-02-19 DIAGNOSIS — M720 Palmar fascial fibromatosis [Dupuytren]: Secondary | ICD-10-CM | POA: Diagnosis not present

## 2018-02-21 DIAGNOSIS — M720 Palmar fascial fibromatosis [Dupuytren]: Secondary | ICD-10-CM | POA: Diagnosis not present

## 2018-03-24 DIAGNOSIS — M720 Palmar fascial fibromatosis [Dupuytren]: Secondary | ICD-10-CM | POA: Diagnosis not present

## 2018-04-01 ENCOUNTER — Other Ambulatory Visit: Payer: Self-pay | Admitting: Family Medicine

## 2018-04-01 DIAGNOSIS — F329 Major depressive disorder, single episode, unspecified: Secondary | ICD-10-CM

## 2018-04-01 DIAGNOSIS — F419 Anxiety disorder, unspecified: Principal | ICD-10-CM

## 2018-04-01 DIAGNOSIS — F32A Depression, unspecified: Secondary | ICD-10-CM

## 2018-05-07 ENCOUNTER — Other Ambulatory Visit: Payer: Self-pay | Admitting: Family Medicine

## 2018-05-07 DIAGNOSIS — R350 Frequency of micturition: Principal | ICD-10-CM

## 2018-05-07 DIAGNOSIS — N401 Enlarged prostate with lower urinary tract symptoms: Secondary | ICD-10-CM

## 2018-05-20 DIAGNOSIS — G4733 Obstructive sleep apnea (adult) (pediatric): Secondary | ICD-10-CM | POA: Diagnosis not present

## 2018-06-24 ENCOUNTER — Other Ambulatory Visit: Payer: Self-pay | Admitting: Family Medicine

## 2018-06-24 DIAGNOSIS — F419 Anxiety disorder, unspecified: Principal | ICD-10-CM

## 2018-06-24 DIAGNOSIS — F32A Depression, unspecified: Secondary | ICD-10-CM

## 2018-06-24 DIAGNOSIS — F329 Major depressive disorder, single episode, unspecified: Secondary | ICD-10-CM

## 2018-07-15 ENCOUNTER — Other Ambulatory Visit: Payer: Self-pay | Admitting: Family Medicine

## 2018-07-15 DIAGNOSIS — F329 Major depressive disorder, single episode, unspecified: Secondary | ICD-10-CM

## 2018-07-15 DIAGNOSIS — F419 Anxiety disorder, unspecified: Principal | ICD-10-CM

## 2018-07-15 DIAGNOSIS — Z872 Personal history of diseases of the skin and subcutaneous tissue: Secondary | ICD-10-CM | POA: Diagnosis not present

## 2018-07-15 DIAGNOSIS — L578 Other skin changes due to chronic exposure to nonionizing radiation: Secondary | ICD-10-CM | POA: Diagnosis not present

## 2018-07-15 DIAGNOSIS — Z859 Personal history of malignant neoplasm, unspecified: Secondary | ICD-10-CM | POA: Diagnosis not present

## 2018-07-15 DIAGNOSIS — L918 Other hypertrophic disorders of the skin: Secondary | ICD-10-CM | POA: Diagnosis not present

## 2018-07-22 ENCOUNTER — Other Ambulatory Visit: Payer: Self-pay | Admitting: Family Medicine

## 2018-07-22 DIAGNOSIS — N401 Enlarged prostate with lower urinary tract symptoms: Secondary | ICD-10-CM

## 2018-07-22 DIAGNOSIS — R350 Frequency of micturition: Principal | ICD-10-CM

## 2018-07-26 ENCOUNTER — Other Ambulatory Visit: Payer: Self-pay | Admitting: Family Medicine

## 2018-07-26 DIAGNOSIS — E782 Mixed hyperlipidemia: Secondary | ICD-10-CM

## 2018-07-26 DIAGNOSIS — I10 Essential (primary) hypertension: Secondary | ICD-10-CM

## 2018-07-29 ENCOUNTER — Ambulatory Visit (INDEPENDENT_AMBULATORY_CARE_PROVIDER_SITE_OTHER): Payer: BLUE CROSS/BLUE SHIELD | Admitting: Family Medicine

## 2018-07-29 ENCOUNTER — Encounter: Payer: Self-pay | Admitting: Family Medicine

## 2018-07-29 VITALS — BP 130/80 | HR 72 | Ht 72.0 in | Wt 220.0 lb

## 2018-07-29 DIAGNOSIS — E782 Mixed hyperlipidemia: Secondary | ICD-10-CM | POA: Diagnosis not present

## 2018-07-29 DIAGNOSIS — F419 Anxiety disorder, unspecified: Secondary | ICD-10-CM

## 2018-07-29 DIAGNOSIS — E663 Overweight: Secondary | ICD-10-CM

## 2018-07-29 DIAGNOSIS — R69 Illness, unspecified: Secondary | ICD-10-CM | POA: Diagnosis not present

## 2018-07-29 DIAGNOSIS — N401 Enlarged prostate with lower urinary tract symptoms: Secondary | ICD-10-CM

## 2018-07-29 DIAGNOSIS — R350 Frequency of micturition: Secondary | ICD-10-CM | POA: Diagnosis not present

## 2018-07-29 DIAGNOSIS — F329 Major depressive disorder, single episode, unspecified: Secondary | ICD-10-CM

## 2018-07-29 DIAGNOSIS — Z1211 Encounter for screening for malignant neoplasm of colon: Secondary | ICD-10-CM

## 2018-07-29 DIAGNOSIS — I10 Essential (primary) hypertension: Secondary | ICD-10-CM | POA: Diagnosis not present

## 2018-07-29 LAB — HEMOCCULT GUIAC POC 1CARD (OFFICE): Fecal Occult Blood, POC: NEGATIVE

## 2018-07-29 MED ORDER — LOSARTAN POTASSIUM 100 MG PO TABS: 100.0000 mg | ORAL_TABLET | Freq: Every day | ORAL | 1 refills | Status: DC

## 2018-07-29 MED ORDER — BUPROPION HCL ER (SR) 100 MG PO TB12: 100.0000 mg | ORAL_TABLET | Freq: Two times a day (BID) | ORAL | 1 refills | Status: DC

## 2018-07-29 MED ORDER — ATORVASTATIN CALCIUM 40 MG PO TABS: 40.0000 mg | ORAL_TABLET | Freq: Every day | ORAL | 1 refills | Status: DC

## 2018-07-29 MED ORDER — TAMSULOSIN HCL 0.4 MG PO CAPS: 0.4000 mg | ORAL_CAPSULE | Freq: Every day | ORAL | 1 refills | Status: DC

## 2018-07-29 MED ORDER — PAROXETINE HCL 20 MG PO TABS: 20.0000 mg | ORAL_TABLET | Freq: Every day | ORAL | 1 refills | Status: DC

## 2018-07-29 NOTE — Patient Instructions (Signed)

## 2018-07-29 NOTE — Progress Notes (Signed)
Date:  07/29/2018   Name:  Dennis Navarro   DOB:  03/15/1951   MRN:  [id number]   Chief Complaint: Hyperlipidemia; Hypertension; Benign Prostatic Hypertrophy; and Depression (PHQ=1)  Hyperlipidemia  This is a chronic problem. The current episode started more than 1 year ago. The problem is controlled. Recent lipid tests were reviewed and are normal. He has no history of chronic renal disease, diabetes, hypothyroidism, liver disease, obesity or nephrotic syndrome. There are no known factors aggravating his hyperlipidemia. Pertinent negatives include no chest pain, focal sensory loss, focal weakness, leg pain, myalgias or shortness of breath. Current antihyperlipidemic treatment includes statins. The current treatment provides moderate improvement of lipids. There are no compliance problems.  Risk factors for coronary artery disease include dyslipidemia, male sex and hypertension.  Hypertension  This is a chronic problem. The current episode started more than 1 year ago. The problem has been waxing and waning since onset. The problem is controlled. Pertinent negatives include no anxiety, blurred vision, chest pain, headaches, malaise/fatigue, neck pain, orthopnea, palpitations, peripheral edema, PND, shortness of breath or sweats. There are no associated agents to hypertension. Risk factors for coronary artery disease include dyslipidemia, obesity and post-menopausal state. Past treatments include angiotensin blockers. The current treatment provides moderate improvement. There are no compliance problems.  There is no history of angina, kidney disease, CAD/MI, CVA, heart failure, left ventricular hypertrophy, PVD or retinopathy. There is no history of chronic renal disease, a hypertension causing med or renovascular disease.  Benign Prostatic Hypertrophy  This is a chronic problem. The current episode started more than 1 year ago. The problem is unchanged. Irritative symptoms do not include  frequency, nocturia or urgency. Obstructive symptoms do not include incomplete emptying, an intermittent stream or a slower stream. Pertinent negatives include no chills, dysuria, genital pain, hematuria, hesitancy, nausea or vomiting. Past treatments include tamsulosin. The treatment provided mild relief.  Depression         This is a chronic problem.  The current episode started more than 1 year ago.   The onset quality is gradual.   The problem occurs intermittently.  The problem has been waxing and waning since onset.  Associated symptoms include no decreased concentration, no fatigue, no helplessness, no hopelessness, does not have insomnia, not irritable, no restlessness, no decreased interest, no appetite change, no body aches, no myalgias, no headaches, no indigestion, not sad and no suicidal ideas.  Past treatments include SSRIs - Selective serotonin reuptake inhibitors and other medications (welbutrin).  Compliance with treatment is good.  Risk factors include a change in medication usage/dosage.    Pertinent negatives include no hypothyroidism and no anxiety.   Review of Systems  Constitutional: Negative for appetite change, chills, fatigue, fever, malaise/fatigue and unexpected weight change.  HENT: Negative for drooling, ear discharge, ear pain, facial swelling, hearing loss, nosebleeds, sneezing, sore throat and trouble swallowing.   Eyes: Negative for blurred vision, photophobia, pain, discharge, redness, itching and visual disturbance.  Respiratory: Negative for cough, choking, chest tightness, shortness of breath and wheezing.   Cardiovascular: Negative for chest pain, palpitations, orthopnea, leg swelling and PND.  Gastrointestinal: Negative for abdominal pain, blood in stool, constipation, diarrhea, nausea, rectal pain and vomiting.  Endocrine: Negative for cold intolerance, heat intolerance, polydipsia, polyphagia and polyuria.  Genitourinary: Negative for decreased urine volume,  difficulty urinating, discharge, dysuria, flank pain, frequency, hematuria, hesitancy, incomplete emptying, nocturia, penile pain, penile swelling, scrotal swelling, testicular pain and urgency.  Musculoskeletal: Negative for back  pain, joint swelling, myalgias, neck pain and neck stiffness.  Skin: Negative for color change and rash.  Allergic/Immunologic: Negative for environmental allergies and immunocompromised state.  Neurological: Negative for dizziness, tremors, focal weakness, seizures, syncope, speech difficulty, weakness, light-headedness, numbness and headaches.  Hematological: Does not bruise/bleed easily.  Psychiatric/Behavioral: Positive for depression. Negative for agitation, behavioral problems, confusion, decreased concentration, dysphoric mood, hallucinations, self-injury and suicidal ideas. The patient is not nervous/anxious and does not have insomnia.     Patient Active Problem List   Diagnosis Date Noted  . Nocturia 09/12/2016  . Essential hypertension 12/15/2015  . Mixed hyperlipidemia 12/15/2015  . Anxiety and depression 12/15/2015    No Known Allergies  Past Surgical History:  Procedure Laterality Date  . APPENDECTOMY    . COLONOSCOPY  2013   cleared for 5 yrs- Dr Vira Agar  . HAND SURGERY Bilateral   . HERNIA REPAIR     x 3  . SEPTOPLASTY    . SKIN CANCER EXCISION     under tongue  . squamous cell cancer removal from chest      Social History   Tobacco Use  . Smoking status: Former Research scientist (life sciences)  . Smokeless tobacco: Never Used  . Tobacco comment: quit 40 years  Substance Use Topics  . Alcohol use: Yes  . Drug use: No     Medication list has been reviewed and updated.  Current Meds  Medication Sig  . aspirin EC 81 MG tablet Take 81 mg by mouth daily.  Marland Kitchen atorvastatin (LIPITOR) 40 MG tablet TAKE 1 TABLET BY MOUTH EVERY DAY  . buPROPion (WELLBUTRIN SR) 100 MG 12 hr tablet TAKE 1 TABLET BY MOUTH TWICE A DAY  . losartan (COZAAR) 100 MG tablet TAKE 1  TABLET BY MOUTH EVERY DAY  . Multiple Vitamin (MULTI-VITAMINS) TABS Take 1 tablet by mouth daily.  Marland Kitchen PARoxetine (PAXIL) 20 MG tablet TAKE 1 TABLET BY MOUTH EVERY DAY  . tamsulosin (FLOMAX) 0.4 MG CAPS capsule TAKE 1 CAPSULE BY MOUTH EVERY DAY    PHQ 2/9 Scores 07/29/2018 01/08/2018 04/22/2017 12/15/2015  PHQ - 2 Score 0 0 0 0  PHQ- 9 Score 1 0 0 -    Physical Exam Vitals signs and nursing note reviewed.  Constitutional:      General: He is not irritable. HENT:     Head: Normocephalic.     Right Ear: Tympanic membrane, ear canal and external ear normal.     Left Ear: Tympanic membrane, ear canal and external ear normal.     Nose: Nose normal. No congestion or rhinorrhea.     Mouth/Throat:     Pharynx: No posterior oropharyngeal erythema.  Eyes:     General: No scleral icterus (psa).       Right eye: No discharge.        Left eye: No discharge.     Conjunctiva/sclera: Conjunctivae normal.     Pupils: Pupils are equal, round, and reactive to light.  Neck:     Musculoskeletal: Normal range of motion and neck supple. No neck rigidity or muscular tenderness.     Thyroid: No thyromegaly.     Vascular: No carotid bruit or JVD.     Trachea: No tracheal deviation.  Cardiovascular:     Rate and Rhythm: Regular rhythm. Tachycardia present.     Heart sounds: Normal heart sounds. No murmur. No friction rub. No gallop.   Pulmonary:     Effort: No respiratory distress.     Breath sounds: Normal  breath sounds. No stridor. No wheezing, rhonchi or rales.  Chest:     Chest wall: No tenderness.  Abdominal:     General: Abdomen is flat. Bowel sounds are normal. There is no distension.     Palpations: Abdomen is soft. There is no mass.     Tenderness: There is no abdominal tenderness. There is no guarding or rebound.     Hernia: No hernia is present.  Musculoskeletal: Normal range of motion.        General: No tenderness.  Lymphadenopathy:     Cervical: No cervical adenopathy.  Skin:     General: Skin is warm and dry.     Coloration: Skin is not pale.     Findings: No rash.  Neurological:     Mental Status: He is alert and oriented to person, place, and time.     Cranial Nerves: No cranial nerve deficit.     Deep Tendon Reflexes: Reflexes are normal and symmetric.     BP 130/80   Pulse 72   Ht 6' (1.829 m)   Wt 220 lb (99.8 kg)   BMI 29.84 kg/m   Assessment and Plan:  1. Anxiety and depression Chronic, stable on meds- refill paroxetine and bupropion - PARoxetine (PAXIL) 20 MG tablet; Take 1 tablet (20 mg total) by mouth daily.  Dispense: 90 tablet; Refill: 1 - buPROPion (WELLBUTRIN SR) 100 MG 12 hr tablet; Take 1 tablet (100 mg total) by mouth 2 (two) times daily.  Dispense: 180 tablet; Refill: 1  2. Benign prostatic hyperplasia with urinary frequency Chronic, stable improved on med- refill tamsulosin/ draw psa - PSA - tamsulosin (FLOMAX) 0.4 MG CAPS capsule; Take 1 capsule (0.4 mg total) by mouth daily.  Dispense: 90 capsule; Refill: 1  3. Essential hypertension Chronic, stable- refill losartan/ draw renal panel - Renal Function Panel - losartan (COZAAR) 100 MG tablet; Take 1 tablet (100 mg total) by mouth daily.  Dispense: 90 tablet; Refill: 1  4. Mixed hyperlipidemia Chronic, stable on meds- refill atorvastatin/ draw lipid - Lipid Panel With LDL/HDL Ratio - atorvastatin (LIPITOR) 40 MG tablet; Take 1 tablet (40 mg total) by mouth daily.  Dispense: 90 tablet; Refill: 1  5. Taking medication for chronic disease Draw hepatic function for chronic med  - Hepatic function panel  6. Overweight (BMI 25.0-29.9) Discussed diet and weight control  7. Colon cancer screening Stool negative for blood - POCT Occult Blood Stool

## 2018-07-30 LAB — HEPATIC FUNCTION PANEL
ALT: 26 IU/L (ref 0–44)
AST: 17 IU/L (ref 0–40)
Alkaline Phosphatase: 49 IU/L (ref 39–117)
BILIRUBIN TOTAL: 0.6 mg/dL (ref 0.0–1.2)
Bilirubin, Direct: 0.17 mg/dL (ref 0.00–0.40)
TOTAL PROTEIN: 6.6 g/dL (ref 6.0–8.5)

## 2018-07-30 LAB — RENAL FUNCTION PANEL
Albumin: 4.5 g/dL (ref 3.6–4.8)
BUN/Creatinine Ratio: 15 (ref 10–24)
BUN: 14 mg/dL (ref 8–27)
CALCIUM: 9.6 mg/dL (ref 8.6–10.2)
CO2: 23 mmol/L (ref 20–29)
CREATININE: 0.94 mg/dL (ref 0.76–1.27)
Chloride: 103 mmol/L (ref 96–106)
GFR calc Af Amer: 97 mL/min/{1.73_m2} (ref >59)
GFR calc non Af Amer: 84 mL/min/{1.73_m2} (ref >59)
Glucose: 117 mg/dL — ABNORMAL HIGH (ref 65–99)
PHOSPHORUS: 3.4 mg/dL (ref 2.5–4.5)
Potassium: 4.4 mmol/L (ref 3.5–5.2)
SODIUM: 139 mmol/L (ref 134–144)

## 2018-07-30 LAB — PSA: Prostate Specific Ag, Serum: 0.5 ng/mL (ref 0.0–4.0)

## 2018-07-30 LAB — LIPID PANEL WITH LDL/HDL RATIO
CHOLESTEROL TOTAL: 193 mg/dL (ref 100–199)
HDL: 83 mg/dL (ref >39)
LDL Calculated: 87 mg/dL (ref 0–99)
LDl/HDL Ratio: 1 ratio (ref 0.0–3.6)
TRIGLYCERIDES: 117 mg/dL (ref 0–149)
VLDL Cholesterol Cal: 23 mg/dL (ref 5–40)

## 2018-12-08 ENCOUNTER — Other Ambulatory Visit: Payer: Self-pay

## 2018-12-08 ENCOUNTER — Ambulatory Visit
Admission: RE | Admit: 2018-12-08 | Discharge: 2018-12-08 | Disposition: A | Discharge status: Home / Self Care | Payer: BLUE CROSS/BLUE SHIELD | Source: Ambulatory Visit | Attending: Family Medicine | Admitting: Family Medicine

## 2018-12-08 ENCOUNTER — Encounter: Payer: Self-pay | Admitting: Family Medicine

## 2018-12-08 ENCOUNTER — Ambulatory Visit
Admission: RE | Admit: 2018-12-08 | Discharge: 2018-12-08 | Disposition: A | Discharge status: Home / Self Care | Payer: BLUE CROSS/BLUE SHIELD | Attending: Family Medicine | Admitting: Family Medicine

## 2018-12-08 ENCOUNTER — Ambulatory Visit (INDEPENDENT_AMBULATORY_CARE_PROVIDER_SITE_OTHER): Payer: BLUE CROSS/BLUE SHIELD | Admitting: Family Medicine

## 2018-12-08 VITALS — BP 138/80 | HR 64 | Ht 72.0 in | Wt 211.0 lb

## 2018-12-08 DIAGNOSIS — J0100 Acute maxillary sinusitis, unspecified: Secondary | ICD-10-CM

## 2018-12-08 DIAGNOSIS — M542 Cervicalgia: Secondary | ICD-10-CM

## 2018-12-08 DIAGNOSIS — M4692 Unspecified inflammatory spondylopathy, cervical region: Secondary | ICD-10-CM | POA: Diagnosis not present

## 2018-12-08 MED ORDER — CYCLOBENZAPRINE HCL 10 MG PO TABS: 10.0000 mg | ORAL_TABLET | Freq: Every day | ORAL | 0 refills | Status: DC

## 2018-12-08 MED ORDER — MELOXICAM 15 MG PO TABS: 15.0000 mg | ORAL_TABLET | Freq: Every day | ORAL | 2 refills | Status: DC

## 2018-12-08 MED ORDER — AMOXICILLIN-POT CLAVULANATE 875-125 MG PO TABS: 1.0000 | ORAL_TABLET | Freq: Two times a day (BID) | ORAL | 0 refills | Status: DC

## 2018-12-08 NOTE — Progress Notes (Signed)
Date:  12/08/2018   Name:  Dennis Navarro   DOB:  [birth year]   MRN:  [id number]   Chief Complaint: Neck Pain (hurts to turn head) and Sore Throat (off and on for a month)  Neck Pain   This is a chronic problem. The current episode started more than 1 month ago (2 months). The problem has been gradually worsening. The pain is associated with a sleep position. The pain is present in the right side and occipital region. The quality of the pain is described as aching. The pain is at a severity of 5/10. The pain is moderate. The symptoms are aggravated by twisting (esp to right). The pain is same all the time. Stiffness is present in the morning. Associated symptoms include headaches. Pertinent negatives include no chest pain, fever, leg pain, numbness, pain with swallowing, paresis, photophobia, syncope, tingling, visual change, weakness or weight loss. He has tried nothing for the symptoms. The treatment provided mild relief.  Sinusitis  This is a new problem. The current episode started 1 to 4 weeks ago (2 weeks). The problem has been gradually worsening since onset. There has been no fever. His pain is at a severity of 3/10. The pain is mild. Associated symptoms include congestion, headaches, sinus pressure, sneezing and a sore throat. Pertinent negatives include no chills, coughing, diaphoresis, ear pain, hoarse voice, neck pain, shortness of breath or swollen glands.    Review of Systems  Constitutional: Negative for chills, diaphoresis, fever and weight loss.  HENT: Positive for congestion, sinus pressure, sneezing and sore throat. Negative for drooling, ear discharge, ear pain and hoarse voice.   Eyes: Negative for photophobia.  Respiratory: Negative for cough, shortness of breath and wheezing.   Cardiovascular: Negative for chest pain, palpitations, leg swelling and syncope.  Gastrointestinal: Negative for abdominal pain, blood in stool, constipation, diarrhea and nausea.  Endocrine:  Negative for polydipsia.  Genitourinary: Negative for dysuria, frequency, hematuria and urgency.  Musculoskeletal: Negative for back pain, myalgias and neck pain.  Skin: Negative for rash.  Allergic/Immunologic: Negative for environmental allergies.  Neurological: Positive for headaches. Negative for dizziness, tingling, weakness and numbness.  Hematological: Does not bruise/bleed easily.  Psychiatric/Behavioral: Negative for suicidal ideas. The patient is not nervous/anxious.     Patient Active Problem List   Diagnosis Date Noted  . Nocturia 09/12/2016  . Essential hypertension 12/15/2015  . Mixed hyperlipidemia 12/15/2015  . Anxiety and depression 12/15/2015    No Known Allergies  Past Surgical History:  Procedure Laterality Date  . APPENDECTOMY    . COLONOSCOPY  2013   cleared for 5 yrs- Dr Vira Agar  . HAND SURGERY Bilateral   . HERNIA REPAIR     x 3  . SEPTOPLASTY    . SKIN CANCER EXCISION     under tongue  . squamous cell cancer removal from chest      Social History   Tobacco Use  . Smoking status: Former Research scientist (life sciences)  . Smokeless tobacco: Never Used  . Tobacco comment: quit 40 years  Substance Use Topics  . Alcohol use: Yes  . Drug use: No     Medication list has been reviewed and updated.  Current Meds  Medication Sig  . aspirin EC 81 MG tablet Take 81 mg by mouth daily.  Marland Kitchen atorvastatin (LIPITOR) 40 MG tablet Take 1 tablet (40 mg total) by mouth daily.  Marland Kitchen buPROPion (WELLBUTRIN SR) 100 MG 12 hr tablet Take 1 tablet (100 mg total) by mouth  2 (two) times daily.  Marland Kitchen losartan (COZAAR) 100 MG tablet Take 1 tablet (100 mg total) by mouth daily.  . Multiple Vitamin (MULTI-VITAMINS) TABS Take 1 tablet by mouth daily.  Marland Kitchen PARoxetine (PAXIL) 20 MG tablet Take 1 tablet (20 mg total) by mouth daily.  . tamsulosin (FLOMAX) 0.4 MG CAPS capsule Take 1 capsule (0.4 mg total) by mouth daily.    PHQ 2/9 Scores 12/08/2018 07/29/2018 01/08/2018 04/22/2017  PHQ - 2 Score 0 0 0 0   PHQ- 9 Score 0 1 0 0    BP Readings from Last 3 Encounters:  12/08/18 138/80  07/29/18 130/80  01/08/18 138/80    Physical Exam Vitals signs and nursing note reviewed.  HENT:     Head: Normocephalic.     Right Ear: Tympanic membrane, ear canal and external ear normal.     Left Ear: Tympanic membrane, ear canal and external ear normal.     Nose: Nose normal.     Mouth/Throat:     Mouth: Mucous membranes are moist.     Dentition: Normal dentition.     Tongue: No lesions. Tongue does not deviate from midline.     Palate: No mass and lesions.     Pharynx: Oropharynx is clear. Uvula midline. No pharyngeal swelling, oropharyngeal exudate, posterior oropharyngeal erythema or uvula swelling.     Tonsils: No tonsillar exudate or tonsillar abscesses.  Eyes:     General: No scleral icterus.       Right eye: No discharge.        Left eye: No discharge.     Conjunctiva/sclera: Conjunctivae normal.     Pupils: Pupils are equal, round, and reactive to light.  Neck:     Musculoskeletal: Normal range of motion and neck supple. Normal range of motion. Pain with movement present. No neck rigidity, injury, torticollis, spinous process tenderness or muscular tenderness.     Thyroid: No thyroid mass, thyromegaly or thyroid tenderness.     Vascular: No JVD.     Trachea: No tracheal deviation.  Cardiovascular:     Rate and Rhythm: Normal rate and regular rhythm.     Heart sounds: Normal heart sounds. No murmur. No friction rub. No gallop.   Pulmonary:     Effort: No respiratory distress.     Breath sounds: Normal breath sounds. No wheezing or rales.  Abdominal:     General: Bowel sounds are normal.     Palpations: Abdomen is soft. There is no mass.     Tenderness: There is no abdominal tenderness. There is no guarding or rebound.  Musculoskeletal: Normal range of motion.        General: No tenderness.     Cervical back: He exhibits spasm. He exhibits normal range of motion, no tenderness and  no bony tenderness.  Lymphadenopathy:     Head:     Right side of head: No submandibular adenopathy.     Left side of head: No submandibular adenopathy.     Cervical: No cervical adenopathy.     Right cervical: No superficial, deep or posterior cervical adenopathy.    Left cervical: No superficial, deep or posterior cervical adenopathy.  Skin:    General: Skin is warm.     Findings: No rash.  Neurological:     Mental Status: He is alert and oriented to person, place, and time.     Cranial Nerves: No cranial nerve deficit.     Deep Tendon Reflexes: Reflexes are normal and symmetric.  Wt Readings from Last 3 Encounters:  12/08/18 211 lb (95.7 kg)  07/29/18 220 lb (99.8 kg)  01/08/18 218 lb (98.9 kg)    BP 138/80   Pulse 64   Ht 6' (1.829 m)   Wt 211 lb (95.7 kg)   BMI 28.62 kg/m   Assessment and Plan:  1. Cervicalgia Chronic.  Acute exacerbation.  Patient has had ongoing neck discomfort in the right posterior area but also a new onset of pain in the right sternocleidomastoid.  Patient does have concerns about the possibility of emptying more ominous.  Examination of the neck and throat reveals no suggestion of this at this time however we have discussed the need that we may have to have this further evaluated by ENT and or cervical CT in the future.  In the meantime we will get a cervical spine to evaluate for likely degenerative disease as well as initiating meloxicam 15 mg daily, Flexeril 10 mg nightly.  - DG Cervical Spine Complete; Future  2. Acute maxillary sinusitis, recurrence not specified Acute.  Persistent.  Patient has had sinus drainage and frontal sinus tenderness for the past several weeks.  Will initiate Augmentin 875 twice a day.

## 2019-01-25 ENCOUNTER — Other Ambulatory Visit: Payer: Self-pay | Admitting: Family Medicine

## 2019-01-25 DIAGNOSIS — E782 Mixed hyperlipidemia: Secondary | ICD-10-CM

## 2019-01-25 DIAGNOSIS — F32A Depression, unspecified: Secondary | ICD-10-CM

## 2019-01-25 DIAGNOSIS — N401 Enlarged prostate with lower urinary tract symptoms: Secondary | ICD-10-CM

## 2019-01-25 DIAGNOSIS — I10 Essential (primary) hypertension: Secondary | ICD-10-CM

## 2019-01-25 DIAGNOSIS — F329 Major depressive disorder, single episode, unspecified: Secondary | ICD-10-CM

## 2019-01-28 ENCOUNTER — Ambulatory Visit: Payer: Self-pay | Admitting: Family Medicine

## 2019-01-30 ENCOUNTER — Encounter: Payer: Self-pay | Admitting: Family Medicine

## 2019-01-30 ENCOUNTER — Other Ambulatory Visit: Payer: Self-pay

## 2019-01-30 ENCOUNTER — Ambulatory Visit (INDEPENDENT_AMBULATORY_CARE_PROVIDER_SITE_OTHER): Payer: BC Managed Care – PPO | Admitting: Family Medicine

## 2019-01-30 VITALS — BP 130/78 | HR 68 | Ht 72.0 in | Wt 213.0 lb

## 2019-01-30 DIAGNOSIS — E782 Mixed hyperlipidemia: Secondary | ICD-10-CM

## 2019-01-30 DIAGNOSIS — F419 Anxiety disorder, unspecified: Secondary | ICD-10-CM

## 2019-01-30 DIAGNOSIS — M542 Cervicalgia: Secondary | ICD-10-CM | POA: Diagnosis not present

## 2019-01-30 DIAGNOSIS — I10 Essential (primary) hypertension: Secondary | ICD-10-CM | POA: Diagnosis not present

## 2019-01-30 DIAGNOSIS — F329 Major depressive disorder, single episode, unspecified: Secondary | ICD-10-CM

## 2019-01-30 DIAGNOSIS — R350 Frequency of micturition: Secondary | ICD-10-CM

## 2019-01-30 DIAGNOSIS — N401 Enlarged prostate with lower urinary tract symptoms: Secondary | ICD-10-CM

## 2019-01-30 DIAGNOSIS — R69 Illness, unspecified: Secondary | ICD-10-CM

## 2019-01-30 DIAGNOSIS — E663 Overweight: Secondary | ICD-10-CM

## 2019-01-30 MED ORDER — ATORVASTATIN CALCIUM 40 MG PO TABS: 40.0000 mg | ORAL_TABLET | Freq: Every day | ORAL | 1 refills | Status: DC

## 2019-01-30 MED ORDER — PAROXETINE HCL 20 MG PO TABS: 20.0000 mg | ORAL_TABLET | Freq: Every day | ORAL | 1 refills | Status: DC

## 2019-01-30 MED ORDER — TAMSULOSIN HCL 0.4 MG PO CAPS: 0.4000 mg | ORAL_CAPSULE | Freq: Every day | ORAL | 1 refills | Status: DC

## 2019-01-30 MED ORDER — LOSARTAN POTASSIUM 100 MG PO TABS: 100.0000 mg | ORAL_TABLET | Freq: Every day | ORAL | 1 refills | Status: DC

## 2019-01-30 MED ORDER — BUPROPION HCL ER (SR) 100 MG PO TB12: 100.0000 mg | ORAL_TABLET | Freq: Two times a day (BID) | ORAL | 1 refills | Status: DC

## 2019-01-30 MED ORDER — MELOXICAM 15 MG PO TABS: 15.0000 mg | ORAL_TABLET | Freq: Every day | ORAL | 5 refills | Status: DC

## 2019-01-30 NOTE — Progress Notes (Signed)
Date:  01/30/2019   Name:  Dennis Navarro   DOB:  Aug 12, 1951   MRN:  [id number]   Chief Complaint: Hypertension, Hyperlipidemia, Benign Prostatic Hypertrophy, Depression (PHQ9=0), and Arthritis (degenerative pain in neck)  Hypertension This is a chronic problem. The current episode started more than 1 year ago. The problem has been gradually improving since onset. The problem is controlled. Pertinent negatives include no anxiety, blurred vision, chest pain, headaches, malaise/fatigue, neck pain, orthopnea, palpitations, peripheral edema, PND, shortness of breath or sweats. There are no associated agents to hypertension. There are no known risk factors for coronary artery disease. Past treatments include angiotensin blockers. The current treatment provides moderate improvement. There are no compliance problems.  There is no history of angina, kidney disease, CAD/MI, CVA, heart failure, left ventricular hypertrophy, PVD or retinopathy. There is no history of chronic renal disease, a hypertension causing med or renovascular disease.  Hyperlipidemia This is a chronic problem. The current episode started more than 1 year ago. The problem is controlled. Recent lipid tests were reviewed and are normal. He has no history of chronic renal disease, diabetes, hypothyroidism, liver disease, obesity or nephrotic syndrome. Pertinent negatives include no chest pain, focal sensory loss, focal weakness, leg pain, myalgias or shortness of breath. Current antihyperlipidemic treatment includes statins. The current treatment provides moderate improvement of lipids. There are no compliance problems.  There are no known risk factors for coronary artery disease.  Benign Prostatic Hypertrophy This is a chronic problem. The current episode started more than 1 year ago. The problem is unchanged. Irritative symptoms do not include frequency, nocturia or urgency. Obstructive symptoms do not include dribbling, incomplete  emptying, an intermittent stream, a slower stream, straining or a weak stream. Pertinent negatives include no chills, dysuria, genital pain, hematuria, hesitancy, nausea or vomiting. Past treatments include tamsulosin. The treatment provided moderate relief.  Depression        This is a chronic problem.  The current episode started more than 1 year ago.   The problem occurs every several days.  Associated symptoms include no decreased concentration, no fatigue, no helplessness, no hopelessness, does not have insomnia, not irritable, no restlessness, no decreased interest, no appetite change, no body aches, no myalgias, no headaches, no indigestion, not sad and no suicidal ideas.  Past treatments include SSRIs - Selective serotonin reuptake inhibitors.  Compliance with treatment is good.  Previous treatment provided moderate relief.   Pertinent negatives include no hypothyroidism and no anxiety. Arthritis Presents for follow-up visit. He reports no pain, stiffness, joint swelling or joint warmth. The symptoms have been stable. Affected locations include the neck. Pertinent negatives include no diarrhea, dysuria, fatigue, fever or rash.    Review of Systems  Constitutional: Negative for appetite change, chills, fatigue, fever and malaise/fatigue.  HENT: Negative for drooling, ear discharge, ear pain and sore throat.   Eyes: Negative for blurred vision, pain, redness and itching.  Respiratory: Negative for cough, choking, chest tightness, shortness of breath, wheezing and stridor.   Cardiovascular: Negative for chest pain, palpitations, orthopnea, leg swelling and PND.  Gastrointestinal: Negative for abdominal pain, blood in stool, constipation, diarrhea, nausea and vomiting.  Endocrine: Negative for polydipsia.  Genitourinary: Negative for dysuria, frequency, hematuria, hesitancy, incomplete emptying, nocturia and urgency.  Musculoskeletal: Positive for arthritis. Negative for back pain, joint  swelling, myalgias, neck pain and stiffness.  Skin: Negative for rash.  Allergic/Immunologic: Negative for environmental allergies.  Neurological: Negative for dizziness, focal weakness and headaches.  Hematological:  Does not bruise/bleed easily.  Psychiatric/Behavioral: Positive for depression. Negative for decreased concentration and suicidal ideas. The patient is not nervous/anxious and does not have insomnia.     Patient Active Problem List   Diagnosis Date Noted  . Nocturia 09/12/2016  . Essential hypertension 12/15/2015  . Mixed hyperlipidemia 12/15/2015  . Anxiety and depression 12/15/2015    No Known Allergies  Past Surgical History:  Procedure Laterality Date  . APPENDECTOMY    . COLONOSCOPY  2013   cleared for 5 yrs- Dr Vira Agar  . HAND SURGERY Bilateral   . HERNIA REPAIR     x 3  . SEPTOPLASTY    . SKIN CANCER EXCISION     under tongue  . squamous cell cancer removal from chest      Social History   Tobacco Use  . Smoking status: Former Research scientist (life sciences)  . Smokeless tobacco: Never Used  . Tobacco comment: quit 40 years  Substance Use Topics  . Alcohol use: Yes  . Drug use: No     Medication list has been reviewed and updated.  Current Meds  Medication Sig  . aspirin EC 81 MG tablet Take 81 mg by mouth daily.  Marland Kitchen atorvastatin (LIPITOR) 40 MG tablet TAKE 1 TABLET BY MOUTH EVERY DAY  . buPROPion (WELLBUTRIN SR) 100 MG 12 hr tablet TAKE 1 TABLET BY MOUTH TWICE A DAY  . losartan (COZAAR) 100 MG tablet TAKE 1 TABLET BY MOUTH EVERY DAY  . meloxicam (MOBIC) 15 MG tablet Take 1 tablet (15 mg total) by mouth daily.  . Multiple Vitamin (MULTI-VITAMINS) TABS Take 1 tablet by mouth daily.  Marland Kitchen PARoxetine (PAXIL) 20 MG tablet TAKE 1 TABLET BY MOUTH EVERY DAY  . tamsulosin (FLOMAX) 0.4 MG CAPS capsule TAKE 1 CAPSULE BY MOUTH EVERY DAY    PHQ 2/9 Scores 01/30/2019 12/08/2018 07/29/2018 01/08/2018  PHQ - 2 Score 0 0 0 0  PHQ- 9 Score 0 0 1 0    BP Readings from Last 3  Encounters:  01/30/19 130/78  12/08/18 138/80  07/29/18 130/80    Physical Exam Vitals signs and nursing note reviewed.  Constitutional:      General: He is not irritable.    Appearance: He is overweight. He is not ill-appearing.  HENT:     Head: Normocephalic.     Right Ear: Tympanic membrane, ear canal and external ear normal.     Left Ear: Tympanic membrane, ear canal and external ear normal.     Nose: Nose normal.  Eyes:     General: No scleral icterus.       Right eye: No discharge.        Left eye: No discharge.     Conjunctiva/sclera: Conjunctivae normal.     Pupils: Pupils are equal, round, and reactive to light.  Neck:     Musculoskeletal: Normal range of motion and neck supple.     Thyroid: No thyroid mass, thyromegaly or thyroid tenderness.     Vascular: No JVD.     Trachea: No tracheal deviation.  Cardiovascular:     Rate and Rhythm: Normal rate and regular rhythm.  No extrasystoles are present.    Pulses: Normal pulses.     Heart sounds: Normal heart sounds and S1 normal. No murmur. No systolic murmur. No diastolic murmur. No friction rub. No gallop. No S3 or S4 sounds.   Pulmonary:     Effort: No respiratory distress.     Breath sounds: Normal breath sounds. No wheezing  or rales.  Abdominal:     General: Bowel sounds are normal.     Palpations: Abdomen is soft. There is no mass.     Tenderness: There is no abdominal tenderness. There is no guarding or rebound.  Genitourinary:    Prostate: Normal. Not enlarged, not tender and no nodules present.     Rectum: Normal. Guaiac result negative. No mass.  Musculoskeletal: Normal range of motion.        General: No tenderness.     Cervical back: He exhibits normal range of motion, no tenderness, no bony tenderness and no spasm.     Right lower leg: No edema.     Left lower leg: No edema.  Lymphadenopathy:     Cervical: No cervical adenopathy.  Skin:    General: Skin is warm.     Findings: No rash.   Neurological:     Mental Status: He is alert and oriented to person, place, and time.     Cranial Nerves: No cranial nerve deficit.     Deep Tendon Reflexes: Reflexes are normal and symmetric.     Wt Readings from Last 3 Encounters:  01/30/19 213 lb (96.6 kg)  12/08/18 211 lb (95.7 kg)  07/29/18 220 lb (99.8 kg)    BP 130/78   Pulse 68   Ht 6' (1.829 m)   Wt 213 lb (96.6 kg)   BMI 28.89 kg/m   Assessment and Plan:  1. Mixed hyperlipidemia Chronic.  Controlled.  Continue atorvastatin 40 mg once a day. - atorvastatin (LIPITOR) 40 MG tablet; Take 1 tablet (40 mg total) by mouth daily.  Dispense: 90 tablet; Refill: 1  2. Anxiety and depression Persistent.  Yet controlled.  Will continue Wellbutrin 100 mg 1 tablet twice a day and Paxil 20 mg 1 a day.  PHQ was noted to be 0 today. - buPROPion (WELLBUTRIN SR) 100 MG 12 hr tablet; Take 1 tablet (100 mg total) by mouth 2 (two) times daily.  Dispense: 180 tablet; Refill: 1 - PARoxetine (PAXIL) 20 MG tablet; Take 1 tablet (20 mg total) by mouth daily.  Dispense: 90 tablet; Refill: 1  3. Essential hypertension Chronic.  Controlled.  Continue losartan 100 mg once a day will recheck in 6 months - losartan (COZAAR) 100 MG tablet; Take 1 tablet (100 mg total) by mouth daily.  Dispense: 90 tablet; Refill: 1  4. Cervicalgia Neck pain has nearly resolved on meloxicam 15 mg once a day will continue 30 mg once a day as needed - meloxicam (MOBIC) 15 MG tablet; Take 1 tablet (15 mg total) by mouth daily.  Dispense: 30 tablet; Refill: 5  5. Benign prostatic hyperplasia with urinary frequency History of BPH with urinary frequency is controlled on tamsulosin 0.4 mg once a day. - tamsulosin (FLOMAX) 0.4 MG CAPS capsule; Take 1 capsule (0.4 mg total) by mouth daily.  Dispense: 90 capsule; Refill: 1  6. Taking medication for chronic disease And is on statin agent will check hepatic function panel.  7. Overweight (BMI 25.0-29.9) Health risks of  being over weight were discussed and patient was counseled on weight loss options and exercise.  Mediterranean diet provided for patient.

## 2019-01-30 NOTE — Patient Instructions (Signed)
Mediterranean Diet  A Mediterranean diet refers to food and lifestyle choices that are based on the traditions of countries located on the [place]. This way of eating has been shown to help prevent certain conditions and improve outcomes for people who have chronic diseases, like kidney disease and heart disease.  What are tips for following this plan?  Lifestyle   Cook and eat meals together with your family, when possible.   Drink enough fluid to keep your urine clear or pale yellow.   Be physically active every day. This includes:  ? Aerobic exercise like running or swimming.  ? Leisure activities like gardening, walking, or housework.   Get 7-8 hours of sleep each night.   If recommended by your health care provider, drink red wine in moderation. This means 1 glass a day for nonpregnant women and 2 glasses a day for men. A glass of wine equals 5 oz (150 mL).  Reading food labels     Check the serving size of packaged foods. For foods such as rice and pasta, the serving size refers to the amount of cooked product, not dry.   Check the total fat in packaged foods. Avoid foods that have saturated fat or trans fats.   Check the ingredients list for added sugars, such as corn syrup.  Shopping   At the grocery store, buy most of your food from the areas near the walls of the store. This includes:  ? Fresh fruits and vegetables (produce).  ? Grains, beans, nuts, and seeds. Some of these may be available in unpackaged forms or large amounts (in bulk).  ? Fresh seafood.  ? Poultry and eggs.  ? Low-fat dairy products.   Buy whole ingredients instead of prepackaged foods.   Buy fresh fruits and vegetables in-season from local farmers markets.   Buy frozen fruits and vegetables in resealable bags.   If you do not have access to quality fresh seafood, buy precooked frozen shrimp or canned fish, such as tuna, salmon, or sardines.   Buy small amounts of raw or cooked vegetables, salads, or olives from  the deli or salad bar at your store.   Stock your pantry so you always have certain foods on hand, such as olive oil, canned tuna, canned tomatoes, rice, pasta, and beans.  Cooking   Cook foods with extra-virgin olive oil instead of using butter or other vegetable oils.   Have meat as a side dish, and have vegetables or grains as your main dish. This means having meat in small portions or adding small amounts of meat to foods like pasta or stew.   Use beans or vegetables instead of meat in common dishes like chili or lasagna.   Experiment with different cooking methods. Try roasting or broiling vegetables instead of steaming or sauteing them.   Add frozen vegetables to soups, stews, pasta, or rice.   Add nuts or seeds for added healthy fat at each meal. You can add these to yogurt, salads, or vegetable dishes.   Marinate fish or vegetables using olive oil, lemon juice, garlic, and fresh herbs.  Meal planning     Plan to eat 1 vegetarian meal one day each week. Try to work up to 2 vegetarian meals, if possible.   Eat seafood 2 or more times a week.   Have healthy snacks readily available, such as:  ? Vegetable sticks with hummus.  ? Greek yogurt.  ? Fruit and nut trail mix.   Eat balanced   meals throughout the week. This includes:  ? Fruit: 2-3 servings a day  ? Vegetables: 4-5 servings a day  ? Low-fat dairy: 2 servings a day  ? Fish, poultry, or lean meat: 1 serving a day  ? Beans and legumes: 2 or more servings a week  ? Nuts and seeds: 1-2 servings a day  ? Whole grains: 6-8 servings a day  ? Extra-virgin olive oil: 3-4 servings a day   Limit red meat and sweets to only a few servings a month  What are my food choices?   Mediterranean diet  ? Recommended  ? Grains: Whole-grain pasta. Brown rice. Bulgar wheat. Polenta. Couscous. Whole-wheat bread. Oatmeal. Quinoa.  ? Vegetables: Artichokes. Beets. Broccoli. Cabbage. Carrots. Eggplant. Green beans. Chard. Kale. Spinach. Onions. Leeks. Peas. Squash.  Tomatoes. Peppers. Radishes.  ? Fruits: Apples. Apricots. Avocado. Berries. Bananas. Cherries. Dates. Figs. Grapes. Lemons. Melon. Oranges. Peaches. Plums. Pomegranate.  ? Meats and other protein foods: Beans. Almonds. Sunflower seeds. Pine nuts. Peanuts. Cod. Salmon. Scallops. Shrimp. Tuna. Tilapia. Clams. Oysters. Eggs.  ? Dairy: Low-fat milk. Cheese. Greek yogurt.  ? Beverages: Water. Red wine. Herbal tea.  ? Fats and oils: Extra virgin olive oil. Avocado oil. Grape seed oil.  ? Sweets and desserts: Greek yogurt with honey. Baked apples. Poached pears. Trail mix.  ? Seasoning and other foods: Basil. Cilantro. Coriander. Cumin. Mint. Parsley. Sage. Rosemary. Tarragon. Garlic. Oregano. Thyme. Pepper. Balsalmic vinegar. Tahini. Hummus. Tomato sauce. Olives. Mushrooms.  ? Limit these  ? Grains: Prepackaged pasta or rice dishes. Prepackaged cereal with added sugar.  ? Vegetables: Deep fried potatoes (french fries).  ? Fruits: Fruit canned in syrup.  ? Meats and other protein foods: Beef. Pork. Lamb. Poultry with skin. Hot dogs. Bacon.  ? Dairy: Ice cream. Sour cream. Whole milk.  ? Beverages: Juice. Sugar-sweetened soft drinks. Beer. Liquor and spirits.  ? Fats and oils: Butter. Canola oil. Vegetable oil. Beef fat (tallow). Lard.  ? Sweets and desserts: Cookies. Cakes. Pies. Candy.  ? Seasoning and other foods: Mayonnaise. Premade sauces and marinades.  ? The items listed may not be a complete list. Talk with your dietitian about what dietary choices are right for you.  Summary   The Mediterranean diet includes both food and lifestyle choices.   Eat a variety of fresh fruits and vegetables, beans, nuts, seeds, and whole grains.   Limit the amount of red meat and sweets that you eat.   Talk with your health care provider about whether it is safe for you to drink red wine in moderation. This means 1 glass a day for nonpregnant women and 2 glasses a day for men. A glass of wine equals 5 oz (150 mL).  This information  is not intended to replace advice given to you by your health care provider. Make sure you discuss any questions you have with your health care provider.  Document Released: 03/22/2016 Document Revised: 04/24/2016 Document Reviewed: 03/22/2016  [employer] Interactive Patient Education  2019 [employer]

## 2019-06-18 DIAGNOSIS — G4733 Obstructive sleep apnea (adult) (pediatric): Secondary | ICD-10-CM | POA: Diagnosis not present

## 2019-07-21 DIAGNOSIS — L578 Other skin changes due to chronic exposure to nonionizing radiation: Secondary | ICD-10-CM | POA: Diagnosis not present

## 2019-07-21 DIAGNOSIS — Z859 Personal history of malignant neoplasm, unspecified: Secondary | ICD-10-CM | POA: Diagnosis not present

## 2019-07-21 DIAGNOSIS — Z86018 Personal history of other benign neoplasm: Secondary | ICD-10-CM | POA: Diagnosis not present

## 2019-07-21 DIAGNOSIS — Z872 Personal history of diseases of the skin and subcutaneous tissue: Secondary | ICD-10-CM | POA: Diagnosis not present

## 2019-07-21 DIAGNOSIS — L570 Actinic keratosis: Secondary | ICD-10-CM | POA: Diagnosis not present

## 2019-07-24 DIAGNOSIS — H903 Sensorineural hearing loss, bilateral: Secondary | ICD-10-CM | POA: Diagnosis not present

## 2019-07-24 DIAGNOSIS — H6123 Impacted cerumen, bilateral: Secondary | ICD-10-CM | POA: Diagnosis not present

## 2019-08-05 ENCOUNTER — Ambulatory Visit (INDEPENDENT_AMBULATORY_CARE_PROVIDER_SITE_OTHER): Payer: BC Managed Care – PPO | Admitting: Family Medicine

## 2019-08-05 ENCOUNTER — Encounter: Payer: Self-pay | Admitting: Family Medicine

## 2019-08-05 ENCOUNTER — Other Ambulatory Visit: Payer: Self-pay

## 2019-08-05 VITALS — BP 138/80 | HR 80 | Ht 72.0 in | Wt 216.0 lb

## 2019-08-05 DIAGNOSIS — I10 Essential (primary) hypertension: Secondary | ICD-10-CM

## 2019-08-05 DIAGNOSIS — R69 Illness, unspecified: Secondary | ICD-10-CM

## 2019-08-05 DIAGNOSIS — R350 Frequency of micturition: Secondary | ICD-10-CM | POA: Diagnosis not present

## 2019-08-05 DIAGNOSIS — N401 Enlarged prostate with lower urinary tract symptoms: Secondary | ICD-10-CM

## 2019-08-05 DIAGNOSIS — F419 Anxiety disorder, unspecified: Secondary | ICD-10-CM | POA: Diagnosis not present

## 2019-08-05 DIAGNOSIS — F329 Major depressive disorder, single episode, unspecified: Secondary | ICD-10-CM

## 2019-08-05 DIAGNOSIS — M542 Cervicalgia: Secondary | ICD-10-CM

## 2019-08-05 DIAGNOSIS — E782 Mixed hyperlipidemia: Secondary | ICD-10-CM | POA: Diagnosis not present

## 2019-08-05 DIAGNOSIS — F32A Depression, unspecified: Secondary | ICD-10-CM

## 2019-08-05 MED ORDER — PAROXETINE HCL 20 MG PO TABS: 20.0000 mg | ORAL_TABLET | Freq: Every day | ORAL | 1 refills | Status: DC

## 2019-08-05 MED ORDER — LOSARTAN POTASSIUM 100 MG PO TABS: 100.0000 mg | ORAL_TABLET | Freq: Every day | ORAL | 1 refills | Status: DC

## 2019-08-05 MED ORDER — ATORVASTATIN CALCIUM 40 MG PO TABS: 40.0000 mg | ORAL_TABLET | Freq: Every day | ORAL | 1 refills | Status: DC

## 2019-08-05 MED ORDER — TAMSULOSIN HCL 0.4 MG PO CAPS: 0.4000 mg | ORAL_CAPSULE | Freq: Every day | ORAL | 1 refills | Status: DC

## 2019-08-05 MED ORDER — BUPROPION HCL ER (SR) 100 MG PO TB12: 100.0000 mg | ORAL_TABLET | Freq: Two times a day (BID) | ORAL | 1 refills | Status: DC

## 2019-08-05 MED ORDER — MELOXICAM 15 MG PO TABS: 15.0000 mg | ORAL_TABLET | Freq: Every day | ORAL | 5 refills | Status: DC

## 2019-08-05 NOTE — Progress Notes (Signed)
Date:  08/05/2019   Name:  Dennis Navarro   DOB:  November 27, 1950   MRN:  [id number]   Chief Complaint: Hypertension, Hyperlipidemia, Arthritis, Benign Prostatic Hypertrophy, and Depression (PHQ9=0 and GAD7=0)  Hypertension This is a chronic problem. The current episode started more than 1 year ago. The problem is unchanged. The problem is controlled. Pertinent negatives include no anxiety, blurred vision, chest pain, headaches, malaise/fatigue, neck pain, orthopnea, palpitations, peripheral edema, PND, shortness of breath or sweats. There are no associated agents to hypertension. The current treatment provides moderate improvement. There are no compliance problems.  There is no history of angina, kidney disease, CAD/MI, CVA, heart failure, left ventricular hypertrophy, PVD or retinopathy. There is no history of chronic renal disease, a hypertension causing med or renovascular disease.  Hyperlipidemia This is a chronic problem. The current episode started more than 1 year ago. The problem is controlled. Recent lipid tests were reviewed and are normal. He has no history of chronic renal disease, diabetes, hypothyroidism, liver disease, obesity or nephrotic syndrome. Pertinent negatives include no chest pain, focal sensory loss, focal weakness, leg pain, myalgias or shortness of breath. Current antihyperlipidemic treatment includes statins. The current treatment provides moderate improvement of lipids. There are no compliance problems.  Risk factors for coronary artery disease include dyslipidemia, hypertension and post-menopausal.  Arthritis Presents for follow-up visit. He reports no pain, stiffness, joint swelling or joint warmth. Affected locations include the neck. Pertinent negatives include no diarrhea, dry eyes, dry mouth, dysuria, fatigue, fever, pain at night, pain while resting, rash, Raynaud's syndrome, uveitis or weight loss.  Benign Prostatic Hypertrophy This is a chronic problem. The  current episode started more than 1 year ago. The problem is unchanged. Irritative symptoms include frequency and nocturia. Irritative symptoms do not include urgency. Obstructive symptoms do not include dribbling, incomplete emptying, an intermittent stream, a slower stream, straining or a weak stream. Pertinent negatives include no chills, dysuria, hematuria, hesitancy or nausea.  Depression        This is a chronic problem.  The current episode started more than 1 year ago.   The onset quality is gradual.   The problem has been gradually improving since onset.  Associated symptoms include insomnia.  Associated symptoms include no decreased concentration, no fatigue, no helplessness, no hopelessness, not irritable, no restlessness, no decreased interest, no appetite change, no body aches, no myalgias, no headaches, no indigestion, not sad and no suicidal ideas.  Past treatments include SSRIs - Selective serotonin reuptake inhibitors.  Compliance with treatment is good.  Previous treatment provided moderate relief.   Pertinent negatives include no hypothyroidism and no anxiety.   Lab Results  Component Value Date   CREATININE 0.94 07/29/2018   BUN 14 07/29/2018   NA 139 07/29/2018   K 4.4 07/29/2018   CL 103 07/29/2018   CO2 23 07/29/2018   Lab Results  Component Value Date   CHOL 193 07/29/2018   HDL 83 07/29/2018   LDLCALC 87 07/29/2018   TRIG 117 07/29/2018   CHOLHDL 2.2 01/08/2018   Lab Results  Component Value Date   TSH 1.920 05/06/2017   No results found for: HGBA1C   Review of Systems  Constitutional: Negative for appetite change, chills, fatigue, fever, malaise/fatigue and weight loss.  HENT: Negative for congestion, drooling, ear discharge, ear pain, postnasal drip, rhinorrhea and sore throat.   Eyes: Negative for blurred vision.  Respiratory: Negative for cough, shortness of breath and wheezing.   Cardiovascular: Negative  for chest pain, palpitations, orthopnea, leg  swelling and PND.  Gastrointestinal: Negative for abdominal pain, blood in stool, constipation, diarrhea and nausea.  Endocrine: Negative for polydipsia, polyphagia and polyuria.  Genitourinary: Positive for frequency and nocturia. Negative for dysuria, hematuria, hesitancy, incomplete emptying and urgency.  Musculoskeletal: Positive for arthritis. Negative for back pain, joint swelling, myalgias, neck pain and stiffness.  Skin: Negative for rash.  Allergic/Immunologic: Negative for environmental allergies.  Neurological: Negative for dizziness, focal weakness and headaches.  Hematological: Does not bruise/bleed easily.  Psychiatric/Behavioral: Positive for depression. Negative for decreased concentration and suicidal ideas. The patient has insomnia. The patient is not nervous/anxious.     Patient Active Problem List   Diagnosis Date Noted  . Nocturia 09/12/2016  . Essential hypertension 12/15/2015  . Mixed hyperlipidemia 12/15/2015  . Anxiety and depression 12/15/2015    No Known Allergies  Past Surgical History:  Procedure Laterality Date  . APPENDECTOMY    . COLONOSCOPY  2013   cleared for 5 yrs- Dr Vira Agar  . HAND SURGERY Bilateral   . HERNIA REPAIR     x 3  . SEPTOPLASTY    . SKIN CANCER EXCISION     under tongue  . squamous cell cancer removal from chest      Social History   Tobacco Use  . Smoking status: Former Research scientist (life sciences)  . Smokeless tobacco: Never Used  . Tobacco comment: quit 40 years  Substance Use Topics  . Alcohol use: Yes  . Drug use: No     Medication list has been reviewed and updated.  Current Meds  Medication Sig  . aspirin EC 81 MG tablet Take 81 mg by mouth daily.  Marland Kitchen atorvastatin (LIPITOR) 40 MG tablet Take 1 tablet (40 mg total) by mouth daily.  Marland Kitchen buPROPion (WELLBUTRIN SR) 100 MG 12 hr tablet Take 1 tablet (100 mg total) by mouth 2 (two) times daily.  Marland Kitchen losartan (COZAAR) 100 MG tablet Take 1 tablet (100 mg total) by mouth daily.  . meloxicam  (MOBIC) 15 MG tablet Take 1 tablet (15 mg total) by mouth daily.  . Multiple Vitamin (MULTI-VITAMINS) TABS Take 1 tablet by mouth daily.  Marland Kitchen PARoxetine (PAXIL) 20 MG tablet Take 1 tablet (20 mg total) by mouth daily.  . tamsulosin (FLOMAX) 0.4 MG CAPS capsule Take 1 capsule (0.4 mg total) by mouth daily.    PHQ 2/9 Scores 08/05/2019 01/30/2019 12/08/2018 07/29/2018  PHQ - 2 Score 0 0 0 0  PHQ- 9 Score 0 0 0 1    BP Readings from Last 3 Encounters:  08/05/19 138/80  01/30/19 130/78  12/08/18 138/80    Physical Exam Vitals and nursing note reviewed.  Constitutional:      General: He is not irritable. HENT:     Head: Normocephalic.     Right Ear: Tympanic membrane, ear canal and external ear normal.     Left Ear: Tympanic membrane, ear canal and external ear normal.     Nose: Nose normal. No congestion or rhinorrhea.     Mouth/Throat:     Mouth: Mucous membranes are moist.  Eyes:     General: No scleral icterus.       Right eye: No discharge.        Left eye: No discharge.     Conjunctiva/sclera: Conjunctivae normal.     Pupils: Pupils are equal, round, and reactive to light.  Neck:     Thyroid: No thyromegaly.     Vascular: No JVD.  Trachea: No tracheal deviation.  Cardiovascular:     Rate and Rhythm: Normal rate and regular rhythm.     Heart sounds: Normal heart sounds. No murmur. No friction rub. No gallop.   Pulmonary:     Effort: No respiratory distress.     Breath sounds: Normal breath sounds. No wheezing, rhonchi or rales.  Abdominal:     General: Bowel sounds are normal. There is no distension.     Palpations: Abdomen is soft. There is no mass.     Tenderness: There is no abdominal tenderness. There is no right CVA tenderness, left CVA tenderness, guarding or rebound.     Hernia: No hernia is present.  Genitourinary:    Prostate: Normal. Not enlarged, not tender and no nodules present.     Rectum: Normal. Guaiac result negative.  Musculoskeletal:         General: No tenderness. Normal range of motion.     Cervical back: Normal range of motion and neck supple.  Lymphadenopathy:     Cervical: No cervical adenopathy.  Skin:    General: Skin is warm.     Capillary Refill: Capillary refill takes less than 2 seconds.     Coloration: Skin is not jaundiced or pale.     Findings: No bruising, erythema, lesion or rash.  Neurological:     Mental Status: He is alert and oriented to person, place, and time.     Cranial Nerves: No cranial nerve deficit.     Deep Tendon Reflexes: Reflexes are normal and symmetric.     Wt Readings from Last 3 Encounters:  08/05/19 216 lb (98 kg)  01/30/19 213 lb (96.6 kg)  12/08/18 211 lb (95.7 kg)    BP 138/80   Pulse 80   Ht 6' (1.829 m)   Wt 216 lb (98 kg)   BMI 29.29 kg/m    Assessment and Plan: 1. Essential hypertension Chronic.  Controlled.  Stable.  Continue losartan 100 mg once a day.  Will check renal function panel.  Will recheck in 6 months. - losartan (COZAAR) 100 MG tablet; Take 1 tablet (100 mg total) by mouth daily.  Dispense: 90 tablet; Refill: 1 - Renal Function Panel  2. Anxiety and depression Chronic.  Controlled.  Stable.  PHQ is 1 and gad score 0.  We will continue bupropion SR 100 mg one twice a day.  Will continue Paxil 20 mg 1 a day. - buPROPion (WELLBUTRIN SR) 100 MG 12 hr tablet; Take 1 tablet (100 mg total) by mouth 2 (two) times daily.  Dispense: 180 tablet; Refill: 1 - PARoxetine (PAXIL) 20 MG tablet; Take 1 tablet (20 mg total) by mouth daily.  Dispense: 90 tablet; Refill: 1  3. Benign prostatic hyperplasia with urinary frequency Symptoms have been controlled.  Stable.  We will continue tamsulosin 0.4 mg 1 capsule daily DRE was noted to be normal.  PSA will be checked today. - tamsulosin (FLOMAX) 0.4 MG CAPS capsule; Take 1 capsule (0.4 mg total) by mouth daily.  Dispense: 90 capsule; Refill: 1 - PSA  4. Cervicalgia Discomfort of the necks probably secondary to arthritis.   This is a persistent problem.  We will continue meloxicam 15 mg once a day. - meloxicam (MOBIC) 15 MG tablet; Take 1 tablet (15 mg total) by mouth daily.  Dispense: 30 tablet; Refill: 5  5. Mixed hyperlipidemia Chronic.  Controlled.  Stable.  Continue atorvastatin 40 mg once a day. - atorvastatin (LIPITOR) 40 MG tablet; Take 1  tablet (40 mg total) by mouth daily.  Dispense: 90 tablet; Refill: 1 - Lipid Panel With LDL/HDL Ratio  6. Taking medication for chronic disease Patient is on a statin and we will check hepatic function to monitor for hepatotoxicity. - Hepatic Function Panel (6)

## 2019-08-05 NOTE — Patient Instructions (Signed)

## 2019-08-06 LAB — RENAL FUNCTION PANEL
Albumin: 4.4 g/dL (ref 3.8–4.8)
BUN/Creatinine Ratio: 17 (ref 10–24)
BUN: 14 mg/dL (ref 8–27)
CO2: 20 mmol/L (ref 20–29)
Calcium: 9.6 mg/dL (ref 8.6–10.2)
Chloride: 101 mmol/L (ref 96–106)
Creatinine, Ser: 0.83 mg/dL (ref 0.76–1.27)
GFR calc Af Amer: 105 mL/min/{1.73_m2} (ref >59)
GFR calc non Af Amer: 90 mL/min/{1.73_m2} (ref >59)
Glucose: 119 mg/dL — ABNORMAL HIGH (ref 65–99)
Phosphorus: 3 mg/dL (ref 2.8–4.1)
Potassium: 4.3 mmol/L (ref 3.5–5.2)
Sodium: 139 mmol/L (ref 134–144)

## 2019-08-06 LAB — LIPID PANEL WITH LDL/HDL RATIO
Cholesterol, Total: 177 mg/dL (ref 100–199)
HDL: 93 mg/dL (ref >39)
LDL Chol Calc (NIH): 70 mg/dL (ref 0–99)
LDL/HDL Ratio: 0.8 ratio (ref 0.0–3.6)
Triglycerides: 77 mg/dL (ref 0–149)
VLDL Cholesterol Cal: 14 mg/dL (ref 5–40)

## 2019-08-06 LAB — HEPATIC FUNCTION PANEL (6)
ALT: 34 IU/L (ref 0–44)
AST: 28 IU/L (ref 0–40)
Alkaline Phosphatase: 50 IU/L (ref 39–117)
Bilirubin Total: 0.5 mg/dL (ref 0.0–1.2)
Bilirubin, Direct: 0.17 mg/dL (ref 0.00–0.40)

## 2019-08-06 LAB — PSA: Prostate Specific Ag, Serum: 0.4 ng/mL (ref 0.0–4.0)

## 2019-09-15 ENCOUNTER — Other Ambulatory Visit: Payer: Self-pay | Admitting: Family Medicine

## 2019-09-15 DIAGNOSIS — I10 Essential (primary) hypertension: Secondary | ICD-10-CM

## 2020-02-04 ENCOUNTER — Encounter: Payer: Self-pay | Admitting: Family Medicine

## 2020-02-04 ENCOUNTER — Ambulatory Visit (INDEPENDENT_AMBULATORY_CARE_PROVIDER_SITE_OTHER): Payer: BC Managed Care – PPO | Admitting: Family Medicine

## 2020-02-04 ENCOUNTER — Other Ambulatory Visit: Payer: Self-pay

## 2020-02-04 DIAGNOSIS — F419 Anxiety disorder, unspecified: Secondary | ICD-10-CM | POA: Diagnosis not present

## 2020-02-04 DIAGNOSIS — M542 Cervicalgia: Secondary | ICD-10-CM

## 2020-02-04 DIAGNOSIS — E782 Mixed hyperlipidemia: Secondary | ICD-10-CM | POA: Diagnosis not present

## 2020-02-04 DIAGNOSIS — F329 Major depressive disorder, single episode, unspecified: Secondary | ICD-10-CM

## 2020-02-04 DIAGNOSIS — R350 Frequency of micturition: Secondary | ICD-10-CM

## 2020-02-04 DIAGNOSIS — I10 Essential (primary) hypertension: Secondary | ICD-10-CM

## 2020-02-04 DIAGNOSIS — N401 Enlarged prostate with lower urinary tract symptoms: Secondary | ICD-10-CM

## 2020-02-04 MED ORDER — LOSARTAN POTASSIUM 100 MG PO TABS: 100.0000 mg | ORAL_TABLET | Freq: Every day | ORAL | 1 refills | Status: DC

## 2020-02-04 MED ORDER — MELOXICAM 15 MG PO TABS: 15.0000 mg | ORAL_TABLET | Freq: Every day | ORAL | 1 refills | Status: DC

## 2020-02-04 MED ORDER — ATORVASTATIN CALCIUM 40 MG PO TABS: 40.0000 mg | ORAL_TABLET | Freq: Every day | ORAL | 1 refills | Status: DC

## 2020-02-04 MED ORDER — PAROXETINE HCL 20 MG PO TABS: 20.0000 mg | ORAL_TABLET | Freq: Every day | ORAL | 1 refills | Status: DC

## 2020-02-04 MED ORDER — MELOXICAM 15 MG PO TABS: 15.0000 mg | ORAL_TABLET | Freq: Every day | ORAL | 5 refills | Status: DC

## 2020-02-04 MED ORDER — BUPROPION HCL ER (SR) 100 MG PO TB12: 100.0000 mg | ORAL_TABLET | Freq: Two times a day (BID) | ORAL | 1 refills | Status: DC

## 2020-02-04 MED ORDER — TAMSULOSIN HCL 0.4 MG PO CAPS: 0.4000 mg | ORAL_CAPSULE | Freq: Every day | ORAL | 1 refills | Status: DC

## 2020-02-04 NOTE — Progress Notes (Signed)
Date:  02/04/2020   Name:  Dennis Navarro   DOB:  12/15/1950   MRN:  [id number]   Chief Complaint: Anxiety (follow up ), Depression, Hyperlipidemia, Hypertension, and Benign Prostatic Hypertrophy  Anxiety Presents for initial visit. The problem has been gradually improving. Patient reports no chest pain, compulsions, confusion, decreased concentration, depressed mood, dizziness, dry mouth, excessive worry, feeling of choking, hyperventilation, impotence, insomnia, irritability, malaise, muscle tension, nausea, nervous/anxious behavior, obsessions, palpitations, panic, restlessness, shortness of breath or suicidal ideas. The severity of symptoms is mild. The quality of sleep is good.   There is no history of depression or suicide attempts. Past treatments include SSRIs. The treatment provided moderate relief. Compliance with prior treatments has been good.  Depression        The patient presents with no depression.  This is a chronic problem.  The current episode started more than 1 year ago.   Associated symptoms include no decreased concentration, no fatigue, no helplessness, no hopelessness, does not have insomnia, not irritable, no restlessness, no decreased interest, no appetite change, no body aches, no myalgias, no headaches, no indigestion, not sad and no suicidal ideas.  Past treatments include SSRIs - Selective serotonin reuptake inhibitors.  Past medical history includes anxiety.     Pertinent negatives include no chronic fatigue syndrome, no chronic pain, no fibromyalgia, no hypothyroidism, no thyroid problem, no chronic illness, no recent illness, no life-threatening condition, no physical disability, no terminal illness, no recent psychiatric admission, no Alzheimer's disease, no brain trauma, no dementia, no bipolar disorder, no eating disorder, no depression, no mental health disorder, no obsessive-compulsive disorder, no post-traumatic stress disorder, no schizophrenia, no suicide  attempts and no head trauma. Hyperlipidemia This is a chronic problem. The current episode started more than 1 year ago. The problem is controlled. Recent lipid tests were reviewed and are normal. He has no history of chronic renal disease, diabetes, hypothyroidism, liver disease, obesity or nephrotic syndrome. Factors aggravating his hyperlipidemia include thiazides. Pertinent negatives include no chest pain, focal weakness, myalgias or shortness of breath. Current antihyperlipidemic treatment includes statins. The current treatment provides moderate improvement of lipids. There are no compliance problems.  Risk factors for coronary artery disease include hypertension and male sex.  Hypertension This is a chronic problem. The current episode started more than 1 year ago. The problem has been waxing and waning since onset. The problem is controlled. Associated symptoms include anxiety. Pertinent negatives include no blurred vision, chest pain, headaches, malaise/fatigue, neck pain, orthopnea, palpitations, peripheral edema, PND, shortness of breath or sweats. Past treatments include angiotensin blockers. The current treatment provides moderate improvement. There are no compliance problems.  There is no history of angina, kidney disease, CAD/MI, CVA, heart failure, left ventricular hypertrophy, PVD or retinopathy. There is no history of chronic renal disease, a hypertension causing med or a thyroid problem.  Benign Prostatic Hypertrophy This is a chronic problem. The current episode started more than 1 year ago. Irritative symptoms do not include frequency, nocturia or urgency. Obstructive symptoms do not include dribbling, incomplete emptying, an intermittent stream, a slower stream, straining or a weak stream. Pertinent negatives include no chills, dysuria, hematuria or nausea.    Lab Results  Component Value Date   CREATININE 0.83 08/05/2019   BUN 14 08/05/2019   NA 139 08/05/2019   K 4.3 08/05/2019     CL 101 08/05/2019   CO2 20 08/05/2019   Lab Results  Component Value Date   CHOL 177  08/05/2019   HDL 93 08/05/2019   LDLCALC 70 08/05/2019   TRIG 77 08/05/2019   CHOLHDL 2.2 01/08/2018   Lab Results  Component Value Date   TSH 1.920 05/06/2017   No results found for: HGBA1C Lab Results  Component Value Date   WBC 7.2 05/06/2017   HGB 14.1 05/06/2017   HCT 41.9 05/06/2017   MCV 92 05/06/2017   PLT 262 05/06/2017   Lab Results  Component Value Date   ALT 34 08/05/2019   AST 28 08/05/2019   ALKPHOS 50 08/05/2019   BILITOT 0.5 08/05/2019     Review of Systems  Constitutional: Negative for appetite change, chills, fatigue, fever, irritability and malaise/fatigue.  HENT: Negative for drooling, ear discharge, ear pain and sore throat.   Eyes: Negative for blurred vision.  Respiratory: Negative for cough, shortness of breath and wheezing.   Cardiovascular: Negative for chest pain, palpitations, orthopnea, leg swelling and PND.  Gastrointestinal: Negative for abdominal pain, blood in stool, constipation, diarrhea and nausea.  Endocrine: Negative for polydipsia.  Genitourinary: Negative for dysuria, frequency, hematuria, impotence, incomplete emptying, nocturia and urgency.  Musculoskeletal: Negative for back pain, myalgias and neck pain.  Skin: Negative for rash.  Allergic/Immunologic: Negative for environmental allergies.  Neurological: Negative for dizziness, focal weakness and headaches.  Hematological: Does not bruise/bleed easily.  Psychiatric/Behavioral: Positive for depression. Negative for confusion, decreased concentration and suicidal ideas. The patient is not nervous/anxious and does not have insomnia.     Patient Active Problem List   Diagnosis Date Noted  . Nocturia 09/12/2016  . Essential hypertension 12/15/2015  . Mixed hyperlipidemia 12/15/2015  . Anxiety and depression 12/15/2015    No Known Allergies  Past Surgical History:  Procedure  Laterality Date  . APPENDECTOMY    . COLONOSCOPY  2013   cleared for 5 yrs- Dr Vira Agar  . HAND SURGERY Bilateral   . HERNIA REPAIR     x 3  . SEPTOPLASTY    . SKIN CANCER EXCISION     under tongue  . squamous cell cancer removal from chest      Social History   Tobacco Use  . Smoking status: Former Research scientist (life sciences)  . Smokeless tobacco: Never Used  . Tobacco comment: quit 40 years  Substance Use Topics  . Alcohol use: Yes  . Drug use: No     Medication list has been reviewed and updated.  Current Meds  Medication Sig  . aspirin EC 81 MG tablet Take 81 mg by mouth daily.  Marland Kitchen atorvastatin (LIPITOR) 40 MG tablet Take 1 tablet (40 mg total) by mouth daily.  Marland Kitchen buPROPion (WELLBUTRIN SR) 100 MG 12 hr tablet Take 1 tablet (100 mg total) by mouth 2 (two) times daily.  Marland Kitchen losartan (COZAAR) 100 MG tablet Take 1 tablet (100 mg total) by mouth daily.  . meloxicam (MOBIC) 15 MG tablet Take 1 tablet (15 mg total) by mouth daily.  . Multiple Vitamin (MULTI-VITAMINS) TABS Take 1 tablet by mouth daily.  Marland Kitchen PARoxetine (PAXIL) 20 MG tablet Take 1 tablet (20 mg total) by mouth daily.  . tamsulosin (FLOMAX) 0.4 MG CAPS capsule Take 1 capsule (0.4 mg total) by mouth daily.    PHQ 2/9 Scores 02/04/2020 08/05/2019 01/30/2019 12/08/2018  PHQ - 2 Score 0 0 0 0  PHQ- 9 Score 0 0 0 0    GAD 7 : Generalized Anxiety Score 02/04/2020 08/05/2019  Nervous, Anxious, on Edge 0 0  Control/stop worrying 0 0  Worry too much -  different things 0 0  Trouble relaxing 0 0  Restless 0 0  Easily annoyed or irritable 0 0  Afraid - awful might happen 0 0  Total GAD 7 Score 0 0  Anxiety Difficulty Not difficult at all -    BP Readings from Last 3 Encounters:  02/04/20 138/86  08/05/19 138/80  01/30/19 130/78    Physical Exam Vitals and nursing note reviewed.  Constitutional:      General: He is not irritable. HENT:     Head: Normocephalic.     Right Ear: Tympanic membrane, ear canal and external ear normal.      Left Ear: Tympanic membrane, ear canal and external ear normal.     Nose: Nose normal. No congestion or rhinorrhea.  Eyes:     General: No scleral icterus.       Right eye: No discharge.        Left eye: No discharge.     Conjunctiva/sclera: Conjunctivae normal.     Pupils: Pupils are equal, round, and reactive to light.  Neck:     Thyroid: No thyromegaly.     Vascular: No JVD.     Trachea: No tracheal deviation.  Cardiovascular:     Rate and Rhythm: Normal rate and regular rhythm.     Heart sounds: Normal heart sounds. No murmur heard.  No friction rub. No gallop.   Pulmonary:     Effort: Pulmonary effort is normal. No respiratory distress.     Breath sounds: Normal breath sounds. No wheezing, rhonchi or rales.  Abdominal:     General: Bowel sounds are normal.     Palpations: Abdomen is soft. There is no mass.     Tenderness: There is no abdominal tenderness. There is no guarding or rebound.  Musculoskeletal:        General: No tenderness. Normal range of motion.     Cervical back: Normal range of motion and neck supple.  Lymphadenopathy:     Cervical: No cervical adenopathy.  Skin:    General: Skin is warm.     Capillary Refill: Capillary refill takes less than 2 seconds.     Findings: No rash.  Neurological:     General: No focal deficit present.     Mental Status: He is alert and oriented to person, place, and time.     Cranial Nerves: No cranial nerve deficit.     Deep Tendon Reflexes: Reflexes are normal and symmetric.     Wt Readings from Last 3 Encounters:  02/04/20 217 lb (98.4 kg)  08/05/19 216 lb (98 kg)  01/30/19 213 lb (96.6 kg)    BP 138/86   Pulse 75   Ht 6' (1.829 m)   Wt 217 lb (98.4 kg)   SpO2 96%   BMI 29.43 kg/m   Assessment and Plan: 1. Mixed hyperlipidemia Chronic.  Controlled.  Stable.  Continue atorvastatin 40 mg once a day.  Will recheck lipid panel in future. - atorvastatin (LIPITOR) 40 MG tablet; Take 1 tablet (40 mg total) by mouth  daily.  Dispense: 90 tablet; Refill: 1  2. Anxiety and depression Chronic.  Controlled.  Stable.  Currently treated on bupropion SR 100 mg daily.  PHQ score is 0 Gad score is 0 as well. - buPROPion (WELLBUTRIN SR) 100 MG 12 hr tablet; Take 1 tablet (100 mg total) by mouth 2 (two) times daily.  Dispense: 180 tablet; Refill: 1 - PARoxetine (PAXIL) 20 MG tablet; Take 1 tablet (20 mg total)  by mouth daily.  Dispense: 90 tablet; Refill: 1  3. Essential hypertension Chronic.  Controlled.  Stable.  Continue losartan 100 mg daily. - losartan (COZAAR) 100 MG tablet; Take 1 tablet (100 mg total) by mouth daily.  Dispense: 90 tablet; Refill: 1  4. Cervicalgia Chronic.  Controlled.  Stable.  Continue meloxicam 15 mg on an as-needed basis daily. - meloxicam (MOBIC) 15 MG tablet; Take 1 tablet (15 mg total) by mouth daily.  Dispense: 90 tablet; Refill: 1  5. Benign prostatic hyperplasia with urinary frequency Chronic.  Controlled.  Stable.  Continue  Flomax 0.4 mg once a day. - tamsulosin (FLOMAX) 0.4 MG CAPS capsule; Take 1 capsule (0.4 mg total) by mouth daily.  Dispense: 90 capsule; Refill: 1

## 2020-07-01 DIAGNOSIS — G4733 Obstructive sleep apnea (adult) (pediatric): Secondary | ICD-10-CM | POA: Diagnosis not present

## 2020-07-20 DIAGNOSIS — Z86018 Personal history of other benign neoplasm: Secondary | ICD-10-CM | POA: Diagnosis not present

## 2020-07-20 DIAGNOSIS — Z872 Personal history of diseases of the skin and subcutaneous tissue: Secondary | ICD-10-CM | POA: Diagnosis not present

## 2020-07-20 DIAGNOSIS — L578 Other skin changes due to chronic exposure to nonionizing radiation: Secondary | ICD-10-CM | POA: Diagnosis not present

## 2020-07-20 DIAGNOSIS — Z859 Personal history of malignant neoplasm, unspecified: Secondary | ICD-10-CM | POA: Diagnosis not present

## 2020-07-20 DIAGNOSIS — L570 Actinic keratosis: Secondary | ICD-10-CM | POA: Diagnosis not present

## 2020-08-05 IMAGING — CR CERVICAL SPINE - COMPLETE 4+ VIEW
6 of 7 series · 9 of 10 positions shown · non-contrast
Comparison: None.

CLINICAL DATA: Cervicalgia

EXAM:
CERVICAL SPINE - COMPLETE 4+ VIEW

[c-spine lat]
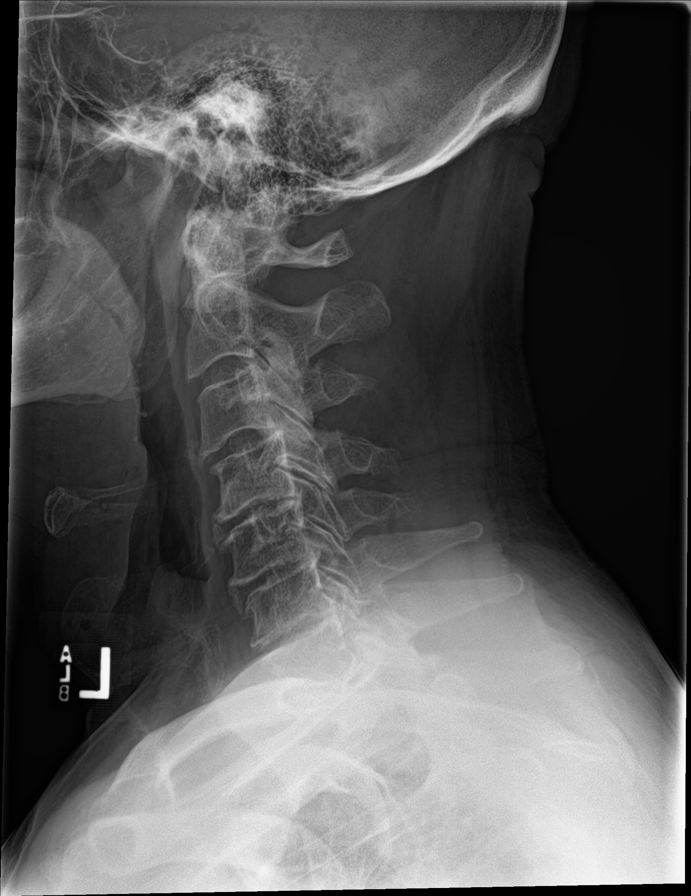

[Series 2: c-spine obl · 0.14mm/px · 2 of 2 slices shown (1 of 2)]
[im 1/2]
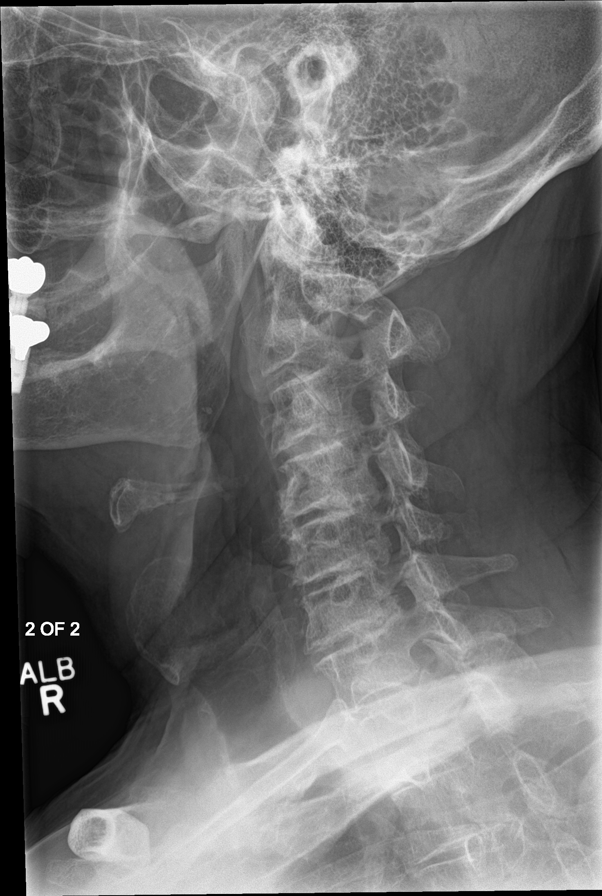
[im 2/2]
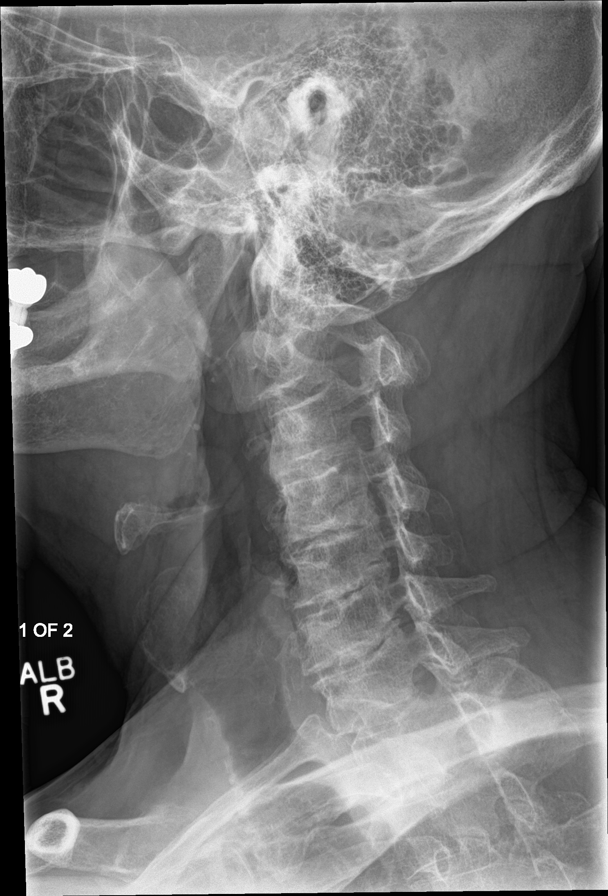

[Series 3: c-spine obl · 0.14mm/px · 3 of 3 slices shown (2 of 2)]
[im 1/3]
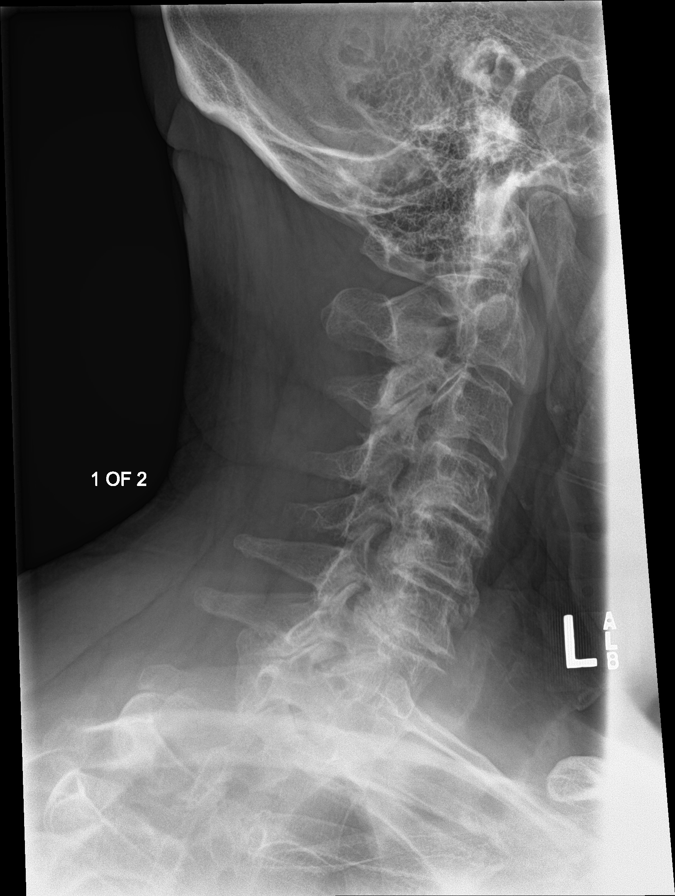
[im 2/3]
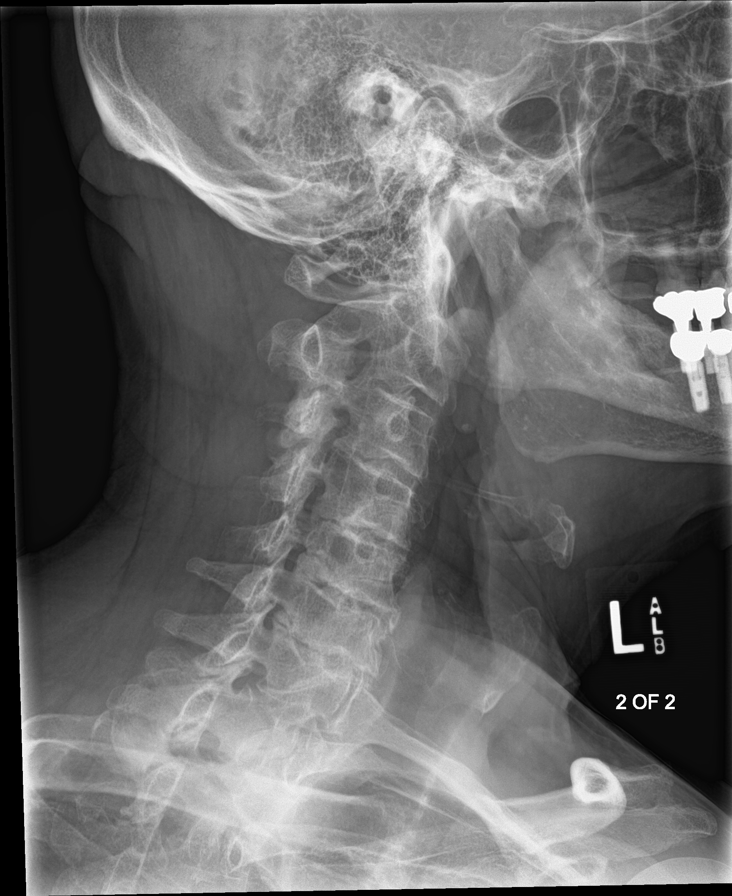
[im 3/3]
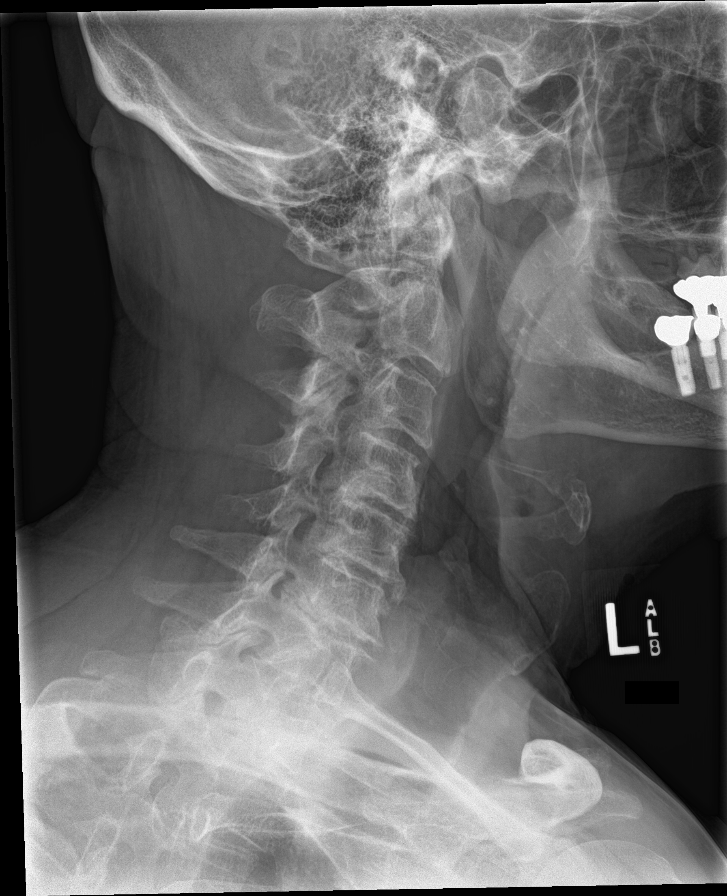

[c-spine ap]
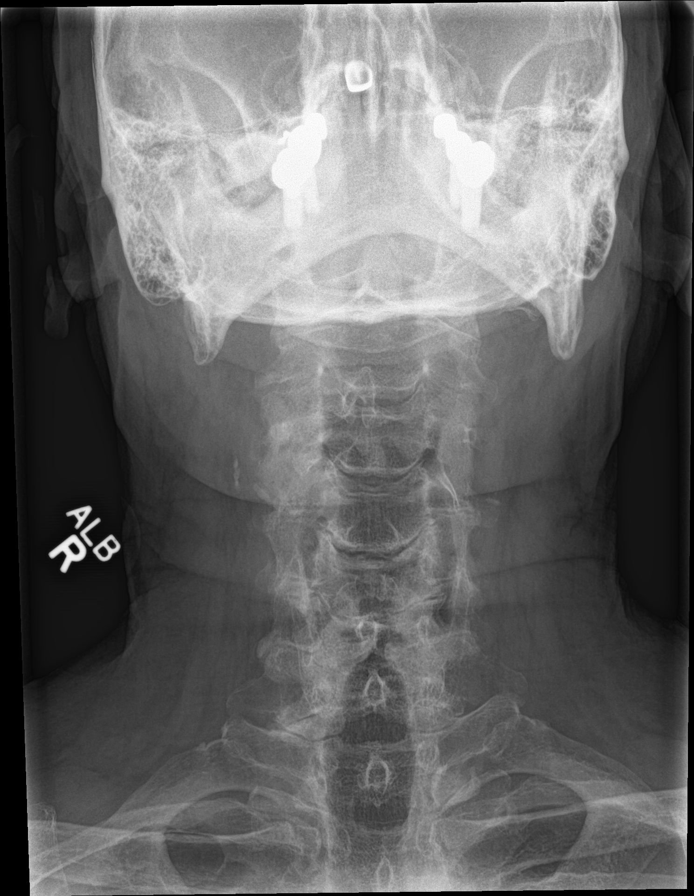

[c-spine open mouth]
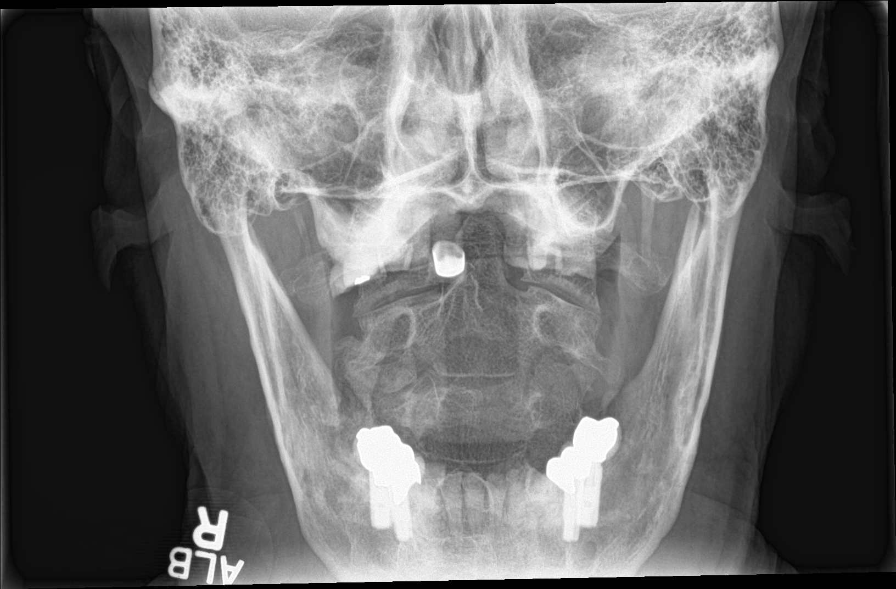

[[person_name]]
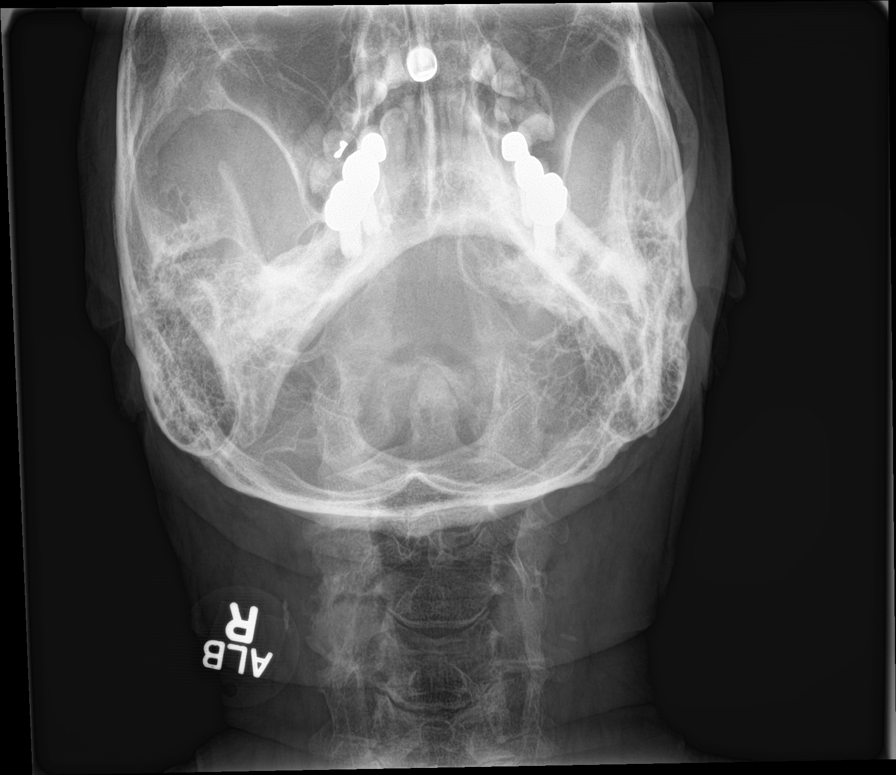

[9 of 10 positions shown; findings below may reference images not displayed]

FINDINGS: Frontal, lateral, open-mouth odontoid, and bilateral oblique views
were obtained. There is no demonstrable fracture or
spondylolisthesis. Prevertebral soft tissues and predental space
regions are normal. There is marked disc space narrowing at C4-5,
C5-6, and C6-7. There is moderate disc space narrowing at C7-T1.
There is facet hypertrophy with exit foraminal narrowing bilaterally
at all levels except for C2-3. No erosive change. Lung apices are
clear. There is calcification in the right carotid artery.
IMPRESSION: Multilevel arthropathy. No fracture or spondylolisthesis. There is
right carotid artery calcification.

## 2020-08-27 ENCOUNTER — Other Ambulatory Visit: Payer: Self-pay | Admitting: Family Medicine

## 2020-08-27 DIAGNOSIS — I10 Essential (primary) hypertension: Secondary | ICD-10-CM

## 2020-09-12 ENCOUNTER — Other Ambulatory Visit: Payer: Self-pay | Admitting: Family Medicine

## 2020-09-12 DIAGNOSIS — F419 Anxiety disorder, unspecified: Secondary | ICD-10-CM

## 2020-09-12 DIAGNOSIS — N401 Enlarged prostate with lower urinary tract symptoms: Secondary | ICD-10-CM

## 2020-09-12 DIAGNOSIS — F32A Depression, unspecified: Secondary | ICD-10-CM

## 2020-09-12 DIAGNOSIS — E782 Mixed hyperlipidemia: Secondary | ICD-10-CM

## 2020-09-12 NOTE — Telephone Encounter (Signed)
Courtesy refill  

## 2020-10-03 DIAGNOSIS — K1329 Other disturbances of oral epithelium, including tongue: Secondary | ICD-10-CM | POA: Diagnosis not present

## 2020-10-06 ENCOUNTER — Other Ambulatory Visit: Payer: Self-pay | Admitting: Family Medicine

## 2020-10-06 DIAGNOSIS — F32A Depression, unspecified: Secondary | ICD-10-CM

## 2020-10-06 DIAGNOSIS — E782 Mixed hyperlipidemia: Secondary | ICD-10-CM

## 2020-10-06 DIAGNOSIS — F419 Anxiety disorder, unspecified: Secondary | ICD-10-CM

## 2020-10-06 DIAGNOSIS — N401 Enlarged prostate with lower urinary tract symptoms: Secondary | ICD-10-CM

## 2020-10-07 ENCOUNTER — Other Ambulatory Visit: Payer: Self-pay | Admitting: Family Medicine

## 2020-10-07 DIAGNOSIS — F419 Anxiety disorder, unspecified: Secondary | ICD-10-CM

## 2020-10-07 DIAGNOSIS — F32A Depression, unspecified: Secondary | ICD-10-CM

## 2020-10-07 NOTE — Telephone Encounter (Signed)
Requested medications are due for refill today yes  Requested medications are on the active medication list yes  Last refill 1/31   Future visit scheduled no  Notes to clinic Has already had a curtesy refill and there is no upcoming appointment scheduled.

## 2020-10-10 ENCOUNTER — Telehealth: Payer: Self-pay

## 2020-10-10 ENCOUNTER — Other Ambulatory Visit: Payer: Self-pay

## 2020-10-10 ENCOUNTER — Ambulatory Visit (INDEPENDENT_AMBULATORY_CARE_PROVIDER_SITE_OTHER): Payer: Medicare Other | Admitting: Family Medicine

## 2020-10-10 ENCOUNTER — Encounter: Payer: Self-pay | Admitting: Family Medicine

## 2020-10-10 VITALS — BP 130/70 | HR 64 | Ht 72.0 in | Wt 219.0 lb

## 2020-10-10 DIAGNOSIS — I10 Essential (primary) hypertension: Secondary | ICD-10-CM

## 2020-10-10 DIAGNOSIS — M542 Cervicalgia: Secondary | ICD-10-CM | POA: Diagnosis not present

## 2020-10-10 DIAGNOSIS — F419 Anxiety disorder, unspecified: Secondary | ICD-10-CM | POA: Diagnosis not present

## 2020-10-10 DIAGNOSIS — E782 Mixed hyperlipidemia: Secondary | ICD-10-CM

## 2020-10-10 DIAGNOSIS — F32A Depression, unspecified: Secondary | ICD-10-CM

## 2020-10-10 DIAGNOSIS — N401 Enlarged prostate with lower urinary tract symptoms: Secondary | ICD-10-CM | POA: Diagnosis not present

## 2020-10-10 DIAGNOSIS — R350 Frequency of micturition: Secondary | ICD-10-CM | POA: Diagnosis not present

## 2020-10-10 MED ORDER — BUPROPION HCL ER (SR) 100 MG PO TB12
100.0000 mg | ORAL_TABLET | Freq: Two times a day (BID) | ORAL | 1 refills | Status: DC
Start: 2020-10-10 — End: 2021-04-11

## 2020-10-10 MED ORDER — ATORVASTATIN CALCIUM 40 MG PO TABS: 40.0000 mg | ORAL_TABLET | Freq: Every day | ORAL | 1 refills | Status: DC

## 2020-10-10 MED ORDER — VALSARTAN 160 MG PO TABS: 160.0000 mg | ORAL_TABLET | Freq: Every day | ORAL | 1 refills | Status: DC

## 2020-10-10 MED ORDER — PAROXETINE HCL 20 MG PO TABS: 20.0000 mg | ORAL_TABLET | Freq: Every day | ORAL | 1 refills | Status: DC

## 2020-10-10 MED ORDER — CYCLOBENZAPRINE HCL 10 MG PO TABS: 10.0000 mg | ORAL_TABLET | Freq: Every day | ORAL | 5 refills | Status: DC

## 2020-10-10 MED ORDER — MELOXICAM 15 MG PO TABS: 15.0000 mg | ORAL_TABLET | Freq: Every day | ORAL | 1 refills | Status: DC

## 2020-10-10 MED ORDER — TAMSULOSIN HCL 0.4 MG PO CAPS
0.4000 mg | ORAL_CAPSULE | Freq: Every day | ORAL | 1 refills | Status: DC
Start: 2020-10-10 — End: 2021-04-07

## 2020-10-10 NOTE — Telephone Encounter (Signed)
This is the info on pt that didn't provide insurance card this morning. He stated his insurance had changed and was told to call back with info

## 2020-10-10 NOTE — Telephone Encounter (Signed)
Copied from Lydia [id number]. Topic: General - Other >> Oct 10, 2020 12:54 PM Pawlus, Brayton Layman A wrote: Reason for CRM: Pt called back to provide Medicare ID [id number] , stated [facility] requested this information.    Pt also provided the dates of his COVID vaccinations to be added to his chart. 10/14/19 second dose, 06/10/20 booster dose.

## 2020-10-10 NOTE — Progress Notes (Signed)
Date:  10/10/2020   Name:  Dennis Navarro   DOB:  [birth year]   MRN:  [id number]   Chief Complaint: Hyperlipidemia, Hypertension, Depression, Benign Prostatic Hypertrophy, and Arthritis  Hyperlipidemia This is a chronic problem. The current episode started more than 1 year ago. The problem is controlled. Recent lipid tests were reviewed and are normal. He has no history of chronic renal disease, diabetes, hypothyroidism, liver disease, obesity or nephrotic syndrome. There are no known factors aggravating his hyperlipidemia. Pertinent negatives include no chest pain, focal sensory loss, focal weakness, leg pain, myalgias or shortness of breath. Current antihyperlipidemic treatment includes statins. The current treatment provides moderate improvement of lipids. There are no compliance problems.  Risk factors for coronary artery disease include dyslipidemia, hypertension and male sex.  Hypertension This is a chronic problem. The current episode started more than 1 year ago. The problem has been gradually improving since onset. The problem is controlled. Pertinent negatives include no anxiety, blurred vision, chest pain, headaches, malaise/fatigue, neck pain, orthopnea, palpitations, peripheral edema, PND, shortness of breath or sweats. There are no associated agents to hypertension. Risk factors for coronary artery disease include dyslipidemia. Past treatments include angiotensin blockers. The current treatment provides moderate improvement. There are no compliance problems.  There is no history of angina, kidney disease, CAD/MI, CVA, heart failure, left ventricular hypertrophy, PVD or retinopathy. There is no history of chronic renal disease, a hypertension causing med or renovascular disease.  Depression        This is a chronic problem.  The current episode started more than 1 year ago.   The onset quality is sudden.   The problem occurs intermittently.  The problem has been gradually improving  since onset.  Associated symptoms include no decreased concentration, no fatigue, no helplessness, no hopelessness, does not have insomnia, not irritable, no restlessness, no decreased interest, no appetite change, no body aches, no myalgias, no headaches, no indigestion, not sad and no suicidal ideas.  Past treatments include SSRIs - Selective serotonin reuptake inhibitors.  Compliance with treatment is good.  Previous treatment provided moderate relief.   Pertinent negatives include no hypothyroidism and no anxiety. Benign Prostatic Hypertrophy This is a chronic problem. The current episode started more than 1 year ago. The problem is unchanged. Irritative symptoms do not include frequency, nocturia or urgency. Pertinent negatives include no chills, dysuria, hematuria, hesitancy or nausea. He is sexually active. Past treatments include tamsulosin. The treatment provided moderate relief.  Arthritis Presents for follow-up visit. He reports no pain, stiffness, joint swelling or joint warmth. The symptoms have been stable. Pertinent negatives include no diarrhea, dysuria, fatigue, fever or rash.    Lab Results  Component Value Date   CREATININE 0.83 08/05/2019   BUN 14 08/05/2019   NA 139 08/05/2019   K 4.3 08/05/2019   CL 101 08/05/2019   CO2 20 08/05/2019   Lab Results  Component Value Date   CHOL 177 08/05/2019   HDL 93 08/05/2019   LDLCALC 70 08/05/2019   TRIG 77 08/05/2019   CHOLHDL 2.2 01/08/2018   Lab Results  Component Value Date   TSH 1.920 05/06/2017   No results found for: HGBA1C Lab Results  Component Value Date   WBC 7.2 05/06/2017   HGB 14.1 05/06/2017   HCT 41.9 05/06/2017   MCV 92 05/06/2017   PLT 262 05/06/2017   Lab Results  Component Value Date   ALT 34 08/05/2019   AST 28 08/05/2019   ALKPHOS  50 08/05/2019   BILITOT 0.5 08/05/2019     Review of Systems  Constitutional: Negative for appetite change, chills, fatigue, fever and malaise/fatigue.  HENT:  Negative for drooling, ear discharge, ear pain and sore throat.   Eyes: Negative for blurred vision.  Respiratory: Negative for cough, shortness of breath and wheezing.   Cardiovascular: Negative for chest pain, palpitations, orthopnea, leg swelling and PND.  Gastrointestinal: Negative for abdominal pain, blood in stool, constipation, diarrhea and nausea.  Endocrine: Negative for polydipsia.  Genitourinary: Negative for dysuria, frequency, hematuria, hesitancy, nocturia and urgency.  Musculoskeletal: Positive for arthritis. Negative for back pain, joint swelling, myalgias, neck pain and stiffness.  Skin: Negative for rash.  Allergic/Immunologic: Negative for environmental allergies.  Neurological: Negative for dizziness, focal weakness and headaches.  Hematological: Does not bruise/bleed easily.  Psychiatric/Behavioral: Positive for depression. Negative for decreased concentration and suicidal ideas. The patient is not nervous/anxious and does not have insomnia.     Patient Active Problem List   Diagnosis Date Noted  . Nocturia 09/12/2016  . Essential hypertension 12/15/2015  . Mixed hyperlipidemia 12/15/2015  . Anxiety and depression 12/15/2015    No Known Allergies  Past Surgical History:  Procedure Laterality Date  . APPENDECTOMY    . COLONOSCOPY  2013   cleared for 5 yrs- Dr Vira Agar  . HAND SURGERY Bilateral   . HERNIA REPAIR     x 3  . SEPTOPLASTY    . SKIN CANCER EXCISION     under tongue  . squamous cell cancer removal from chest      Social History   Tobacco Use  . Smoking status: Former Research scientist (life sciences)  . Smokeless tobacco: Never Used  . Tobacco comment: quit 40 years  Substance Use Topics  . Alcohol use: Yes  . Drug use: No     Medication list has been reviewed and updated.  Current Meds  Medication Sig  . aspirin EC 81 MG tablet Take 81 mg by mouth daily.  Marland Kitchen atorvastatin (LIPITOR) 40 MG tablet TAKE 1 TABLET BY MOUTH EVERY DAY  . buPROPion (WELLBUTRIN SR) 100  MG 12 hr tablet Take 1 tablet (100 mg total) by mouth 2 (two) times daily.  . cyclobenzaprine (FLEXERIL) 10 MG tablet Take 10 mg by mouth at bedtime.  Marland Kitchen losartan (COZAAR) 100 MG tablet TAKE 1 TABLET BY MOUTH EVERY DAY  . meloxicam (MOBIC) 15 MG tablet Take 1 tablet (15 mg total) by mouth daily.  . Multiple Vitamin (MULTI-VITAMINS) TABS Take 1 tablet by mouth daily.  Marland Kitchen PARoxetine (PAXIL) 20 MG tablet TAKE 1 TABLET BY MOUTH EVERY DAY  . tamsulosin (FLOMAX) 0.4 MG CAPS capsule TAKE 1 CAPSULE BY MOUTH EVERY DAY    PHQ 2/9 Scores 10/10/2020 02/04/2020 08/05/2019 01/30/2019  PHQ - 2 Score 0 0 0 0  PHQ- 9 Score 0 0 0 0    GAD 7 : Generalized Anxiety Score 10/10/2020 02/04/2020 08/05/2019  Nervous, Anxious, on Edge 0 0 0  Control/stop worrying 0 0 0  Worry too much - different things 0 0 0  Trouble relaxing 0 0 0  Restless 0 0 0  Easily annoyed or irritable 0 0 0  Afraid - awful might happen 0 0 0  Total GAD 7 Score 0 0 0  Anxiety Difficulty - Not difficult at all -    BP Readings from Last 3 Encounters:  10/10/20 130/70  02/04/20 138/86  08/05/19 138/80    Physical Exam Vitals and nursing note reviewed.  Constitutional:  General: He is not irritable. HENT:     Head: Normocephalic.     Right Ear: Tympanic membrane and external ear normal.     Left Ear: Tympanic membrane and external ear normal.     Nose: Nose normal. No congestion or rhinorrhea.     Mouth/Throat:     Mouth: Oropharynx is clear and moist. Mucous membranes are moist.  Eyes:     General: No scleral icterus.       Right eye: No discharge.        Left eye: No discharge.     Extraocular Movements: EOM normal.     Conjunctiva/sclera: Conjunctivae normal.     Pupils: Pupils are equal, round, and reactive to light.  Neck:     Thyroid: No thyromegaly.     Vascular: No carotid bruit or JVD.     Trachea: No tracheal deviation.  Cardiovascular:     Rate and Rhythm: Normal rate and regular rhythm.     Pulses: Intact  distal pulses.     Heart sounds: Normal heart sounds. No murmur heard. No friction rub. No gallop.   Pulmonary:     Effort: No respiratory distress.     Breath sounds: Normal breath sounds. No wheezing, rhonchi or rales.  Abdominal:     General: Bowel sounds are normal.     Palpations: Abdomen is soft. There is no hepatosplenomegaly or mass.     Tenderness: There is no abdominal tenderness. There is no CVA tenderness, guarding or rebound.  Genitourinary:    Prostate: Normal. Not enlarged, not tender and no nodules present.     Rectum: Normal. No mass.  Musculoskeletal:        General: No tenderness or edema. Normal range of motion.     Cervical back: Normal range of motion and neck supple. No tenderness.  Lymphadenopathy:     Cervical: No cervical adenopathy.  Skin:    General: Skin is warm.     Findings: No rash.  Neurological:     Mental Status: He is alert and oriented to person, place, and time.     Cranial Nerves: No cranial nerve deficit.     Deep Tendon Reflexes: Strength normal and reflexes are normal and symmetric.     Wt Readings from Last 3 Encounters:  10/10/20 219 lb (99.3 kg)  02/04/20 217 lb (98.4 kg)  08/05/19 216 lb (98 kg)    BP 130/70   Pulse 64   Ht 6' (1.829 m)   Wt 219 lb (99.3 kg)   BMI 29.70 kg/m   Assessment and Plan: 1. Mixed hyperlipidemia Chronic.  Controlled.  Stable.  Continue atorvastatin 40 mg once a day.  Will check lipid panel. - atorvastatin (LIPITOR) 40 MG tablet; Take 1 tablet (40 mg total) by mouth daily.  Dispense: 90 tablet; Refill: 1 - Lipid Panel With LDL/HDL Ratio  2. Anxiety and depression Chronic.  Controlled.  Stable.  PHQ is 0.  Gad score is 0.  Continue bupropion SR 100 mg twice a day and Paxil 20 mg once a day. - buPROPion (WELLBUTRIN SR) 100 MG 12 hr tablet; Take 1 tablet (100 mg total) by mouth 2 (two) times daily.  Dispense: 180 tablet; Refill: 1 - PARoxetine (PAXIL) 20 MG tablet; Take 1 tablet (20 mg total) by  mouth daily.  Dispense: 90 tablet; Refill: 1  3. Essential hypertension Chronic.  Controlled.  Stable.  Blood pressure 130/70.  Because of supply issues patient has been switched to  valsartan 160 mg once a day.  Will check comprehensive metabolic panel for electrolytes and GFR. - valsartan (DIOVAN) 160 MG tablet; Take 1 tablet (160 mg total) by mouth daily.  Dispense: 90 tablet; Refill: 1 - Comprehensive Metabolic Panel (CMET)  4. Cervicalgia Chronic.  Controlled.  Stable.  Continue Flexeril 10 mg nightly. - cyclobenzaprine (FLEXERIL) 10 MG tablet; Take 1 tablet (10 mg total) by mouth at bedtime.  Dispense: 30 tablet; Refill: 5 - meloxicam (MOBIC) 15 MG tablet; Take 1 tablet (15 mg total) by mouth daily.  Dispense: 90 tablet; Refill: 1  5. Benign prostatic hyperplasia with urinary frequency Chronic.  Controlled.  Stable.  DRE is normal.  We will continue tamsulosin 0.4 mg.  Will check PSA for current level. - tamsulosin (FLOMAX) 0.4 MG CAPS capsule; Take 1 capsule (0.4 mg total) by mouth daily.  Dispense: 90 capsule; Refill: 1 - PSA

## 2020-10-11 LAB — COMPREHENSIVE METABOLIC PANEL
ALT: 28 IU/L (ref 0–44)
AST: 24 IU/L (ref 0–40)
Albumin/Globulin Ratio: 2.1 (ref 1.2–2.2)
Albumin: 4.4 g/dL (ref 3.8–4.8)
Alkaline Phosphatase: 53 IU/L (ref 44–121)
BUN/Creatinine Ratio: 19 (ref 10–24)
BUN: 13 mg/dL (ref 8–27)
Bilirubin Total: 0.4 mg/dL (ref 0.0–1.2)
CO2: 21 mmol/L (ref 20–29)
Calcium: 9.3 mg/dL (ref 8.6–10.2)
Chloride: 105 mmol/L (ref 96–106)
Creatinine, Ser: 0.7 mg/dL — ABNORMAL LOW (ref 0.76–1.27)
Globulin, Total: 2.1 g/dL (ref 1.5–4.5)
Glucose: 131 mg/dL — ABNORMAL HIGH (ref 65–99)
Potassium: 4.6 mmol/L (ref 3.5–5.2)
Sodium: 142 mmol/L (ref 134–144)
Total Protein: 6.5 g/dL (ref 6.0–8.5)
eGFR: 100 mL/min/{1.73_m2} (ref >59)

## 2020-10-11 LAB — LIPID PANEL WITH LDL/HDL RATIO
Cholesterol, Total: 169 mg/dL (ref 100–199)
HDL: 89 mg/dL (ref >39)
LDL Chol Calc (NIH): 67 mg/dL (ref 0–99)
LDL/HDL Ratio: 0.8 ratio (ref 0.0–3.6)
Triglycerides: 69 mg/dL (ref 0–149)
VLDL Cholesterol Cal: 13 mg/dL (ref 5–40)

## 2020-10-11 LAB — PSA: Prostate Specific Ag, Serum: 0.4 ng/mL (ref 0.0–4.0)

## 2020-10-14 ENCOUNTER — Ambulatory Visit: Payer: Medicare Other

## 2020-10-14 DIAGNOSIS — E162 Hypoglycemia, unspecified: Secondary | ICD-10-CM

## 2020-10-15 LAB — HEMOGLOBIN A1C
Est. average glucose Bld gHb Est-mCnc: 123 mg/dL
Hgb A1c MFr Bld: 5.9 % — ABNORMAL HIGH (ref 4.8–5.6)

## 2020-12-30 ENCOUNTER — Other Ambulatory Visit: Payer: Self-pay

## 2020-12-30 ENCOUNTER — Telehealth: Payer: Self-pay

## 2020-12-30 DIAGNOSIS — R059 Cough, unspecified: Secondary | ICD-10-CM

## 2020-12-30 MED ORDER — BENZONATATE 100 MG PO CAPS: 100.0000 mg | ORAL_CAPSULE | Freq: Two times a day (BID) | ORAL | 0 refills | Status: DC | PRN

## 2020-12-30 NOTE — Telephone Encounter (Signed)
Pt's wife called and they tested positive for COVID, however other than back pain, are not experiencing other symptoms. Have advised wife to quarantine and pick up Mucinex. Will send in [facility]. Also went over the COVID risk score with her. They both are low medium.

## 2020-12-30 NOTE — Progress Notes (Unsigned)
Sent in tessalon perles

## 2021-04-07 ENCOUNTER — Other Ambulatory Visit: Payer: Self-pay | Admitting: Family Medicine

## 2021-04-07 DIAGNOSIS — R350 Frequency of micturition: Secondary | ICD-10-CM

## 2021-04-07 DIAGNOSIS — I10 Essential (primary) hypertension: Secondary | ICD-10-CM

## 2021-04-07 DIAGNOSIS — F419 Anxiety disorder, unspecified: Secondary | ICD-10-CM

## 2021-04-07 DIAGNOSIS — E782 Mixed hyperlipidemia: Secondary | ICD-10-CM

## 2021-04-11 ENCOUNTER — Encounter: Payer: Self-pay | Admitting: Family Medicine

## 2021-04-11 ENCOUNTER — Ambulatory Visit (INDEPENDENT_AMBULATORY_CARE_PROVIDER_SITE_OTHER): Payer: Medicare Other | Admitting: Family Medicine

## 2021-04-11 ENCOUNTER — Other Ambulatory Visit: Payer: Self-pay

## 2021-04-11 VITALS — BP 136/88 | HR 84 | Ht 72.0 in | Wt 218.0 lb

## 2021-04-11 DIAGNOSIS — R350 Frequency of micturition: Secondary | ICD-10-CM

## 2021-04-11 DIAGNOSIS — E782 Mixed hyperlipidemia: Secondary | ICD-10-CM | POA: Diagnosis not present

## 2021-04-11 DIAGNOSIS — F32A Depression, unspecified: Secondary | ICD-10-CM

## 2021-04-11 DIAGNOSIS — R7303 Prediabetes: Secondary | ICD-10-CM | POA: Diagnosis not present

## 2021-04-11 DIAGNOSIS — M542 Cervicalgia: Secondary | ICD-10-CM

## 2021-04-11 DIAGNOSIS — I10 Essential (primary) hypertension: Secondary | ICD-10-CM

## 2021-04-11 DIAGNOSIS — N401 Enlarged prostate with lower urinary tract symptoms: Secondary | ICD-10-CM

## 2021-04-11 DIAGNOSIS — F419 Anxiety disorder, unspecified: Secondary | ICD-10-CM | POA: Diagnosis not present

## 2021-04-11 MED ORDER — PAROXETINE HCL 20 MG PO TABS: 20.0000 mg | ORAL_TABLET | Freq: Every day | ORAL | 1 refills | Status: DC

## 2021-04-11 MED ORDER — CYCLOBENZAPRINE HCL 10 MG PO TABS: 10.0000 mg | ORAL_TABLET | Freq: Every day | ORAL | 5 refills | Status: DC

## 2021-04-11 MED ORDER — MELOXICAM 15 MG PO TABS: 15.0000 mg | ORAL_TABLET | Freq: Every day | ORAL | 1 refills | Status: DC

## 2021-04-11 MED ORDER — TAMSULOSIN HCL 0.4 MG PO CAPS: 0.4000 mg | ORAL_CAPSULE | Freq: Every day | ORAL | 1 refills | Status: DC

## 2021-04-11 MED ORDER — ATORVASTATIN CALCIUM 40 MG PO TABS: 40.0000 mg | ORAL_TABLET | Freq: Every day | ORAL | 1 refills | Status: DC

## 2021-04-11 MED ORDER — VALSARTAN 160 MG PO TABS: 160.0000 mg | ORAL_TABLET | Freq: Every day | ORAL | 1 refills | Status: DC

## 2021-04-11 MED ORDER — BUPROPION HCL ER (SR) 100 MG PO TB12: 100.0000 mg | ORAL_TABLET | Freq: Two times a day (BID) | ORAL | 1 refills | Status: DC

## 2021-04-11 NOTE — Progress Notes (Signed)
Date:  04/11/2021   Name:  Dennis Navarro   DOB:  [birth year]   MRN:  [id number]   Chief Complaint: Hypertension, Benign Prostatic Hypertrophy, Anxiety, Depression, Hyperlipidemia, Neck Pain, and Prediabetes  Hypertension This is a chronic problem. The current episode started more than 1 year ago. The problem has been gradually improving since onset. Associated symptoms include anxiety and neck pain. Pertinent negatives include no blurred vision, chest pain, headaches, palpitations, peripheral edema or shortness of breath. Risk factors for coronary artery disease include stress, male gender and dyslipidemia (prediabetes). Past treatments include angiotensin blockers. The current treatment provides mild improvement. There are no compliance problems.  There is no history of angina, kidney disease, CAD/MI, CVA, heart failure, left ventricular hypertrophy, PVD or retinopathy. There is no history of chronic renal disease, a hypertension causing med or renovascular disease.  Benign Prostatic Hypertrophy This is a chronic problem. The current episode started more than 1 year ago. The problem has been gradually improving since onset. Irritative symptoms do not include frequency, nocturia or urgency. Obstructive symptoms do not include dribbling, incomplete emptying, an intermittent stream, a slower stream, straining or a weak stream. Past treatments include tamsulosin. The treatment provided moderate relief.  Anxiety Presents for follow-up visit. Symptoms include nervous/anxious behavior. Patient reports no chest pain, decreased concentration, dizziness, excessive worry, insomnia, irritability, palpitations, restlessness, shortness of breath or suicidal ideas. Symptoms occur occasionally.    Depression        This is a chronic problem.  The current episode started more than 1 year ago.   The onset quality is gradual.   The problem occurs rarely.  The problem has been gradually improving since onset.   Associated symptoms include no decreased concentration, no fatigue, no helplessness, no hopelessness, does not have insomnia, not irritable, no restlessness, no decreased interest, no appetite change, no body aches, no myalgias, no headaches, no indigestion, not sad and no suicidal ideas.     The symptoms are aggravated by work stress and family issues.  Past treatments include SSRIs - Selective serotonin reuptake inhibitors.  Compliance with treatment is good.  Past medical history includes anxiety.     Pertinent negatives include no hypothyroidism. Hyperlipidemia This is a chronic problem. The current episode started more than 1 year ago. The problem is controlled. Recent lipid tests were reviewed and are normal. He has no history of chronic renal disease, diabetes, hypothyroidism, liver disease, obesity or nephrotic syndrome. Pertinent negatives include no chest pain, myalgias or shortness of breath. Current antihyperlipidemic treatment includes diet change and exercise. The current treatment provides moderate improvement of lipids. There are no compliance problems.  Risk factors for coronary artery disease include hypertension.  Neck Pain  This is a recurrent problem. The current episode started more than 1 year ago. The problem occurs intermittently. The problem has been gradually improving. The pain is associated with an unknown factor. The quality of the pain is described as aching. The pain is mild. The symptoms are aggravated by stress. Pertinent negatives include no chest pain or headaches. He has tried NSAIDs for the symptoms. The treatment provided moderate relief.   Lab Results  Component Value Date   CREATININE 0.70 (L) 10/10/2020   BUN 13 10/10/2020   NA 142 10/10/2020   K 4.6 10/10/2020   CL 105 10/10/2020   CO2 21 10/10/2020   Lab Results  Component Value Date   CHOL 169 10/10/2020   HDL 89 10/10/2020   LDLCALC 67 10/10/2020  TRIG 69 10/10/2020   CHOLHDL 2.2 01/08/2018   Lab  Results  Component Value Date   TSH 1.920 05/06/2017   Lab Results  Component Value Date   HGBA1C 5.9 (H) 10/14/2020   Lab Results  Component Value Date   WBC 7.2 05/06/2017   HGB 14.1 05/06/2017   HCT 41.9 05/06/2017   MCV 92 05/06/2017   PLT 262 05/06/2017   Lab Results  Component Value Date   ALT 28 10/10/2020   AST 24 10/10/2020   ALKPHOS 53 10/10/2020   BILITOT 0.4 10/10/2020     Review of Systems  Constitutional:  Negative for appetite change, fatigue and irritability.  Eyes:  Negative for blurred vision.  Respiratory:  Negative for shortness of breath.   Cardiovascular:  Negative for chest pain and palpitations.  Genitourinary:  Negative for frequency, incomplete emptying, nocturia and urgency.  Musculoskeletal:  Positive for neck pain. Negative for myalgias.  Neurological:  Negative for dizziness and headaches.  Hematological:  Negative for adenopathy.  Psychiatric/Behavioral:  Positive for depression. Negative for decreased concentration and suicidal ideas. The patient is nervous/anxious. The patient does not have insomnia.    Patient Active Problem List   Diagnosis Date Noted   Nocturia 09/12/2016   Essential hypertension 12/15/2015   Mixed hyperlipidemia 12/15/2015   Anxiety and depression 12/15/2015    No Known Allergies  Past Surgical History:  Procedure Laterality Date   APPENDECTOMY     COLONOSCOPY  2013   cleared for 5 yrs- Dr Vira Agar   HAND SURGERY Bilateral    HERNIA REPAIR     x 3   SEPTOPLASTY     SKIN CANCER EXCISION     under tongue   squamous cell cancer removal from chest      Social History   Tobacco Use   Smoking status: Former   Smokeless tobacco: Never   Tobacco comments:    quit 40 years  Substance Use Topics   Alcohol use: Yes   Drug use: No     Medication list has been reviewed and updated.  Current Meds  Medication Sig   aspirin EC 81 MG tablet Take 81 mg by mouth daily.   atorvastatin (LIPITOR) 40 MG tablet  TAKE 1 TABLET BY MOUTH EVERY DAY   buPROPion (WELLBUTRIN SR) 100 MG 12 hr tablet Take 1 tablet (100 mg total) by mouth 2 (two) times daily.   cyclobenzaprine (FLEXERIL) 10 MG tablet Take 1 tablet (10 mg total) by mouth at bedtime.   meloxicam (MOBIC) 15 MG tablet Take 1 tablet (15 mg total) by mouth daily.   PARoxetine (PAXIL) 20 MG tablet TAKE 1 TABLET BY MOUTH EVERY DAY   tamsulosin (FLOMAX) 0.4 MG CAPS capsule TAKE 1 CAPSULE BY MOUTH EVERY DAY   valsartan (DIOVAN) 160 MG tablet TAKE 1 TABLET BY MOUTH EVERY DAY   [DISCONTINUED] benzonatate (TESSALON) 100 MG capsule Take 1 capsule (100 mg total) by mouth 2 (two) times daily as needed for cough.   [DISCONTINUED] Multiple Vitamin (MULTI-VITAMINS) TABS Take 1 tablet by mouth daily.    PHQ 2/9 Scores 04/11/2021 10/10/2020 02/04/2020 08/05/2019  PHQ - 2 Score 0 0 0 0  PHQ- 9 Score 0 0 0 0    GAD 7 : Generalized Anxiety Score 04/11/2021 10/10/2020 02/04/2020 08/05/2019  Nervous, Anxious, on Edge 0 0 0 0  Control/stop worrying 0 0 0 0  Worry too much - different things 0 0 0 0  Trouble relaxing 0 0 0 0  Restless 0 0 0 0  Easily annoyed or irritable 0 0 0 0  Afraid - awful might happen 0 0 0 0  Total GAD 7 Score 0 0 0 0  Anxiety Difficulty - - Not difficult at all -    BP Readings from Last 3 Encounters:  10/10/20 130/70  02/04/20 138/86  08/05/19 138/80    Physical Exam Vitals and nursing note reviewed.  Constitutional:      General: He is not irritable. HENT:     Head: Normocephalic.     Right Ear: Tympanic membrane, ear canal and external ear normal.     Left Ear: Tympanic membrane, ear canal and external ear normal.     Nose: Nose normal.  Eyes:     General: No scleral icterus.       Right eye: No discharge.        Left eye: No discharge.     Conjunctiva/sclera: Conjunctivae normal.     Pupils: Pupils are equal, round, and reactive to light.  Neck:     Thyroid: No thyromegaly.     Vascular: No JVD.     Trachea: No tracheal  deviation.  Cardiovascular:     Rate and Rhythm: Normal rate and regular rhythm.     Heart sounds: Normal heart sounds, S1 normal and S2 normal. No murmur heard. No systolic murmur is present.  No diastolic murmur is present.    No friction rub. No gallop. No S3 or S4 sounds.  Pulmonary:     Effort: Pulmonary effort is normal. No respiratory distress.     Breath sounds: Normal breath sounds. No decreased breath sounds, wheezing, rhonchi or rales.  Abdominal:     General: Bowel sounds are normal.     Palpations: Abdomen is soft. There is no mass.     Tenderness: There is no abdominal tenderness. There is no guarding or rebound.  Musculoskeletal:        General: No tenderness. Normal range of motion.     Cervical back: Normal range of motion and neck supple.     Right lower leg: No edema.     Left lower leg: No edema.  Lymphadenopathy:     Cervical: No cervical adenopathy.  Skin:    General: Skin is warm.     Findings: No rash.  Neurological:     Mental Status: He is alert and oriented to person, place, and time.     Cranial Nerves: Cranial nerves are intact. No cranial nerve deficit.     Sensory: Sensation is intact.     Motor: Motor function is intact.     Deep Tendon Reflexes: Reflexes are normal and symmetric.    Wt Readings from Last 3 Encounters:  04/11/21 218 lb (98.9 kg)  10/10/20 219 lb (99.3 kg)  02/04/20 217 lb (98.4 kg)    Ht 6' (1.829 m)   Wt 218 lb (98.9 kg)   BMI 29.57 kg/m   Assessment and Plan:  1. Essential hypertension Chronic.  Controlled.  Stable.  Blood pressure 136/88.  Continue valsartan 160 mg once a day.  Will check renal function panel for GFR and electrolytes. - valsartan (DIOVAN) 160 MG tablet; Take 1 tablet (160 mg total) by mouth daily.  Dispense: 90 tablet; Refill: 1 - Renal Function Panel  2. Prediabetes Chronic.  Controlled.  Stable.  Currently controlled with diet.  Will check A1c and renal function panel. - HgB A1c - Renal  Function Panel  3. Mixed hyperlipidemia  Chronic.  Controlled.  Stable.  Continue atorvastatin 40 mg once a day. - atorvastatin (LIPITOR) 40 MG tablet; Take 1 tablet (40 mg total) by mouth daily.  Dispense: 90 tablet; Refill: 1  4. Anxiety and depression Chronic.  Controlled.  Stable.  PHQ is 0 Gad score is 4.  Continue bupropion ER 100 mg SR 1 twice a day.  And Paxil 20 mg once a day. - buPROPion ER (WELLBUTRIN SR) 100 MG 12 hr tablet; Take 1 tablet (100 mg total) by mouth 2 (two) times daily.  Dispense: 180 tablet; Refill: 1 - PARoxetine (PAXIL) 20 MG tablet; Take 1 tablet (20 mg total) by mouth daily.  Dispense: 90 tablet; Refill: 1  5. Cervicalgia Chronic.  Controlled.  Stable.  Controlled with cyclobenzaprine 10 mg nightly and meloxicam 15 mg once a day. - cyclobenzaprine (FLEXERIL) 10 MG tablet; Take 1 tablet (10 mg total) by mouth at bedtime.  Dispense: 30 tablet; Refill: 5 - meloxicam (MOBIC) 15 MG tablet; Take 1 tablet (15 mg total) by mouth daily.  Dispense: 90 tablet; Refill: 1  6. Benign prostatic hyperplasia with urinary frequency Chronic.  Controlled.  Stable.  Has nocturia x1 for the most part.  No other symptoms of delayed emptying of bladder.  Will continue tamsulosin 0.4 mg once a day. - tamsulosin (FLOMAX) 0.4 MG CAPS capsule; Take 1 capsule (0.4 mg total) by mouth daily.  Dispense: 90 capsule; Refill: 1

## 2021-04-12 LAB — RENAL FUNCTION PANEL
Albumin: 4.4 g/dL (ref 3.8–4.8)
BUN/Creatinine Ratio: 19 (ref 10–24)
BUN: 15 mg/dL (ref 8–27)
CO2: 23 mmol/L (ref 20–29)
Calcium: 9.4 mg/dL (ref 8.6–10.2)
Chloride: 103 mmol/L (ref 96–106)
Creatinine, Ser: 0.78 mg/dL (ref 0.76–1.27)
Glucose: 126 mg/dL — ABNORMAL HIGH (ref 65–99)
Phosphorus: 2.9 mg/dL (ref 2.8–4.1)
Potassium: 4.3 mmol/L (ref 3.5–5.2)
Sodium: 139 mmol/L (ref 134–144)
eGFR: 97 mL/min/{1.73_m2} (ref >59)

## 2021-04-12 LAB — HEMOGLOBIN A1C
Est. average glucose Bld gHb Est-mCnc: 117 mg/dL
Hgb A1c MFr Bld: 5.7 % — ABNORMAL HIGH (ref 4.8–5.6)

## 2021-07-20 DIAGNOSIS — Z859 Personal history of malignant neoplasm, unspecified: Secondary | ICD-10-CM | POA: Diagnosis not present

## 2021-07-20 DIAGNOSIS — L570 Actinic keratosis: Secondary | ICD-10-CM | POA: Diagnosis not present

## 2021-07-20 DIAGNOSIS — L578 Other skin changes due to chronic exposure to nonionizing radiation: Secondary | ICD-10-CM | POA: Diagnosis not present

## 2021-07-20 DIAGNOSIS — Z872 Personal history of diseases of the skin and subcutaneous tissue: Secondary | ICD-10-CM | POA: Diagnosis not present

## 2021-10-04 ENCOUNTER — Ambulatory Visit (INDEPENDENT_AMBULATORY_CARE_PROVIDER_SITE_OTHER): Payer: Medicare Other

## 2021-10-04 ENCOUNTER — Telehealth: Payer: Self-pay

## 2021-10-04 DIAGNOSIS — Z1211 Encounter for screening for malignant neoplasm of colon: Secondary | ICD-10-CM | POA: Diagnosis not present

## 2021-10-04 DIAGNOSIS — Z0000 Encounter for general adult medical examination without abnormal findings: Secondary | ICD-10-CM | POA: Diagnosis not present

## 2021-10-04 NOTE — Progress Notes (Signed)
Subjective:   Dennis Navarro is a 71 y.o. male who presents for an Initial Medicare Annual Wellness Visit.  Virtual Visit via Telephone Note  I connected with  Dennis Navarro on 10/04/21 at 10:00 AM EST by telephone and verified that I am speaking with the correct person using two identifiers.  Location: Patient: home Provider: [facility] Persons participating in the virtual visit: Dennis Navarro   I discussed the limitations, risks, security and privacy concerns of performing an evaluation and management service by telephone and the availability of in person appointments. The patient expressed understanding and agreed to proceed.  Interactive audio and video telecommunications were attempted between this nurse and patient, however failed, due to patient having technical difficulties OR patient did not have access to video capability.  We continued and completed visit with audio only.  Some vital signs may be absent or patient reported.   Dennis Marker, LPN   Review of Systems     Cardiac Risk Factors include: advanced age (>79men, >74 women);male gender;dyslipidemia;hypertension     Objective:    There were no vitals filed for this visit. There is no height or weight on file to calculate BMI.  Advanced Directives 10/04/2021  Does Patient Have a Medical Advance Directive? No  Would patient like information on creating a medical advance directive? No - Patient declined    Current Medications (verified) Outpatient Encounter Medications as of 10/04/2021  Medication Sig   aspirin EC 81 MG tablet Take 81 mg by mouth daily.   atorvastatin (LIPITOR) 40 MG tablet Take 1 tablet (40 mg total) by mouth daily.   buPROPion ER (WELLBUTRIN SR) 100 MG 12 hr tablet Take 1 tablet (100 mg total) by mouth 2 (two) times daily.   cyclobenzaprine (FLEXERIL) 10 MG tablet Take 1 tablet (10 mg total) by mouth at bedtime.   meloxicam (MOBIC) 15 MG tablet Take 1 tablet (15 mg total) by  mouth daily.   PARoxetine (PAXIL) 20 MG tablet Take 1 tablet (20 mg total) by mouth daily.   tamsulosin (FLOMAX) 0.4 MG CAPS capsule Take 1 capsule (0.4 mg total) by mouth daily.   valsartan (DIOVAN) 160 MG tablet Take 1 tablet (160 mg total) by mouth daily.   No facility-administered encounter medications on file as of 10/04/2021.    Allergies (verified) Patient has no known allergies.   History: Past Medical History:  Diagnosis Date   Anxiety    BPH (benign prostatic hyperplasia)    Depression    Hyperlipidemia    Hypertension    Skin cancer    Sleep apnea    Past Surgical History:  Procedure Laterality Date   APPENDECTOMY     COLONOSCOPY  2013   cleared for 5 yrs- Dr Vira Agar   HAND SURGERY Bilateral    HERNIA REPAIR     x 3   SEPTOPLASTY     SKIN CANCER EXCISION     under tongue   squamous cell cancer removal from chest     Family History  Problem Relation Age of Onset   Prostate cancer Neg Hx    Kidney cancer Neg Hx    Bladder Cancer Neg Hx    Social History   Socioeconomic History   Marital status: Married    Spouse name: Not on file   Number of children: Not on file   Years of education: Not on file   Highest education level: Not on file  Occupational History   Not on file  Tobacco Use   Smoking status: Former   Smokeless tobacco: Never   Tobacco comments:    quit 40 years  Vaping Use   Vaping Use: Never used  Substance and Sexual Activity   Alcohol use: Yes   Drug use: No   Sexual activity: Yes  Other Topics Concern   Not on file  Social History Narrative   Not on file   Social Determinants of Health   Financial Resource Strain: Low Risk    Difficulty of Paying Living Expenses: Not hard at all  Food Insecurity: No Food Insecurity   Worried About Charity fundraiser in the Last Year: Never true   [place] in the Last Year: Never true  Transportation Needs: No Transportation Needs   Lack of Transportation (Medical): No   Lack of  Transportation (Non-Medical): No  Physical Activity: Insufficiently Active   Days of Exercise per Week: 7 days   Minutes of Exercise per Session: 20 min  Stress: No Stress Concern Present   Feeling of Stress : Only a little  Social Connections: Moderately Integrated   Frequency of Communication with Friends and Family: More than three times a week   Frequency of Social Gatherings with Friends and Family: Three times a week   Attends Religious Services: More than 4 times per year   Active Member of Clubs or Organizations: No   Attends Archivist Meetings: Never   Marital Status: Married    Tobacco Counseling Counseling given: Not Answered Tobacco comments: quit 40 years   Clinical Intake:  Pre-visit preparation completed: Yes  Pain : No/denies pain     Nutritional Risks: None Diabetes: No  How often do you need to have someone help you when you read instructions, pamphlets, or other written materials from your doctor or pharmacy?: 1 - Never    Interpreter Needed?: No  Information entered by :: Dennis Marker LPN   Activities of Daily Living In your present state of health, do you have any difficulty performing the following activities: 10/04/2021 04/11/2021  Hearing? Y N  Vision? N N  Difficulty concentrating or making decisions? N N  Walking or climbing stairs? N N  Dressing or bathing? N N  Doing errands, shopping? N N  Preparing Food and eating ? N -  Using the Toilet? N -  In the past six months, have you accidently leaked urine? N -  Do you have problems with loss of bowel control? N -  Managing your Medications? N -  Managing your Finances? N -  Housekeeping or managing your Housekeeping? N -  Some recent data might be hidden    Patient Care Team: Juline Patch, MD as PCP - General (Family Medicine)  Indicate any recent Medical Services you may have received from other than [facility] providers in the past year (date may be approximate).      Assessment:   This is a routine wellness examination for Dennis Navarro.  Hearing/Vision screen Hearing Screening - Comments:: Pt c/o mild hearing difficulty; plans to go to [facility] for hearing evaluation Vision Screening - Comments:: Annual vision screenings done at [facility] issues and exercise activities discussed: Current Exercise Habits: Home exercise routine, Type of exercise: walking, Time (Minutes): 20, Frequency (Times/Week): 7, Weekly Exercise (Minutes/Week): 140, Intensity: Moderate, Exercise limited by: None identified   Goals Addressed             This Visit's Progress  Patient Stated       Patient states he would like to maintain his current health and activity level        Depression Screen PHQ 2/9 Scores 10/04/2021 04/11/2021 10/10/2020 02/04/2020 08/05/2019 01/30/2019 12/08/2018  PHQ - 2 Score 3 0 0 0 0 0 0  PHQ- 9 Score 3 0 0 0 0 0 0    Fall Risk Fall Risk  10/04/2021 04/11/2021 10/10/2020 02/04/2020 04/22/2017  Falls in the past year? 0 0 0 0 Yes  Number falls in past yr: 0 0 - - 1  Injury with Fall? 0 0 - - No  Risk for fall due to : No Fall Risks No Fall Risks - No Fall Risks -  Follow up Falls prevention discussed Falls evaluation completed Falls evaluation completed Falls evaluation completed Falls evaluation completed    FALL RISK PREVENTION PERTAINING TO THE HOME:  Any stairs in or around the home? Yes  If so, are there any without handrails? No  Home free of loose throw rugs in walkways, pet beds, electrical cords, etc? Yes  Adequate lighting in your home to reduce risk of falls? Yes   ASSISTIVE DEVICES UTILIZED TO PREVENT FALLS:  Life alert? No  Use of a cane, walker or w/c? No  Grab bars in the bathroom? Yes  Shower chair or bench in shower? No  Elevated toilet seat or a handicapped toilet? Yes   TIMED UP AND GO:  Was the test performed? No . Telephonic visit.   Cognitive Function: Normal cognitive status assessed  by direct observation by this Nurse Health Advisor. No abnormalities found.          Immunizations Immunization History  Administered Date(s) Administered   Influenza, High Dose Seasonal PF 06/26/2018, 06/30/2020, 07/12/2021   Influenza,inj,Quad PF,6+ Mos 09/12/2016, 04/22/2017   Influenza-Unspecified 06/26/2018, 05/04/2019   PFIZER(Purple Top)SARS-COV-2 Vaccination 09/14/2019, 10/14/2019, 06/10/2020   Pfizer Covid-19 Vaccine Bivalent Booster 104yrs & up 07/04/2021   Pneumococcal Conjugate-13 09/12/2016   Pneumococcal Polysaccharide-23 01/08/2018   Tdap 01/22/2016   Zoster Recombinat (Shingrix) 03/02/2019, 05/04/2019    TDAP status: Up to date  Flu Vaccine status: Up to date  Pneumococcal vaccine status: Up to date  Covid-19 vaccine status: Completed vaccines  Qualifies for Shingles Vaccine? Yes   Zostavax completed No   Shingrix Completed?: Yes  Screening Tests Health Maintenance  Topic Date Due   Hepatitis C Screening  10/10/2021 (Originally 07/17/1969)   COLONOSCOPY (Pts 45-47yrs Insurance coverage will need to be confirmed)  03/28/2022   TETANUS/TDAP  01/21/2026   Pneumonia Vaccine 61+ Years old  Completed   INFLUENZA VACCINE  Completed   COVID-19 Vaccine  Completed   Zoster Vaccines- Shingrix  Completed   HPV VACCINES  Aged Out    Health Maintenance  There are no preventive care reminders to display for this patient.  Colorectal cancer screening: Type of screening: Colonoscopy. Completed 03/28/17. Repeat every 5 years. Referral sent to  GI today  Lung Cancer Screening: (Low Dose CT Chest recommended if Age 93-80 years, 30 pack-year currently smoking OR have quit w/in 15years.) does not qualify.   Additional Screening:  Hepatitis C Screening: does qualify; postponed  Vision Screening: Recommended annual ophthalmology exams for early detection of glaucoma and other disorders of the eye. Is the patient up to date with their annual eye exam?  Yes  Who  is the provider or what is the name of the office in which the patient attends annual eye exams? [facility].   Dental Screening: Recommended annual dental exams for proper oral hygiene  Community Resource Referral / Chronic Care Management: CRR required this visit?  No   CCM required this visit?  No      Plan:     I have personally reviewed and noted the following in the patients chart:   Medical and social history Use of alcohol, tobacco or illicit drugs  Current medications and supplements including opioid prescriptions. Patient is not currently taking opioid prescriptions. Functional ability and status Nutritional status Physical activity Advanced directives List of other physicians Hospitalizations, surgeries, and ER visits in previous 12 months Vitals Screenings to include cognitive, depression, and falls Referrals and appointments  In addition, I have reviewed and discussed with patient certain preventive protocols, quality metrics, and best practice recommendations. A written personalized care plan for preventive services as well as general preventive health recommendations were provided to patient.     Dennis Marker, LPN   [id number]/54/[id number]   Nurse Notes: none

## 2021-10-04 NOTE — Telephone Encounter (Signed)
Called no answer no voicemail

## 2021-10-04 NOTE — Patient Instructions (Signed)
Dennis Navarro , Thank you for taking time to come for your Medicare Wellness Visit. I appreciate your ongoing commitment to your health goals. Please review the following plan we discussed and let me know if I can assist you in the future.   Screening recommendations/referrals: Colonoscopy: done 03/28/17. Referral sent to [facility] for screening colonoscopy. They will contact you for an appointment.  Recommended yearly ophthalmology/optometry visit for glaucoma screening and checkup Recommended yearly dental visit for hygiene and checkup  Vaccinations: Influenza vaccine: done 07/12/21 Pneumococcal vaccine: done 01/08/18 Tdap vaccine: done 01/22/16 Shingles vaccine: done 03/02/19 & 05/04/19   Covid-19: done 09/14/19, 10/14/19, 06/10/20 & 07/04/21  Advanced directives: Advance directive discussed with you today. Even though you declined this today please call our office should you change your mind and we can give you the proper paperwork for you to fill out.   Conditions/risks identified: Keep up the great work!   Next appointment: Follow up in one year for your annual wellness visit.   Preventive Care 73 Years and Older, Male Preventive care refers to lifestyle choices and visits with your health care provider that can promote health and wellness. What does preventive care include? A yearly physical exam. This is also called an annual well check. Dental exams once or twice a year. Routine eye exams. Ask your health care provider how often you should have your eyes checked. Personal lifestyle choices, including: Daily care of your teeth and gums. Regular physical activity. Eating a healthy diet. Avoiding tobacco and drug use. Limiting alcohol use. Practicing safe sex. Taking low doses of aspirin every day. Taking vitamin and mineral supplements as recommended by your health care provider. What happens during an annual well check? The services and screenings done by your  health care provider during your annual well check will depend on your age, overall health, lifestyle risk factors, and family history of disease. Counseling  Your health care provider may ask you questions about your: Alcohol use. Tobacco use. Drug use. Emotional well-being. Home and relationship well-being. Sexual activity. Eating habits. History of falls. Memory and ability to understand (cognition). Work and work Statistician. Screening  You may have the following tests or measurements: Height, weight, and BMI. Blood pressure. Lipid and cholesterol levels. These may be checked every 5 years, or more frequently if you are over 74 years old. Skin check. Lung cancer screening. You may have this screening every year starting at age 32 if you have a 30-pack-year history of smoking and currently smoke or have quit within the past 15 years. Fecal occult blood test (FOBT) of the stool. You may have this test every year starting at age 1. Flexible sigmoidoscopy or colonoscopy. You may have a sigmoidoscopy every 5 years or a colonoscopy every 10 years starting at age 73. Prostate cancer screening. Recommendations will vary depending on your family history and other risks. Hepatitis C blood test. Hepatitis B blood test. Sexually transmitted disease (STD) testing. Diabetes screening. This is done by checking your blood sugar (glucose) after you have not eaten for a while (fasting). You may have this done every 1-3 years. Abdominal aortic aneurysm (AAA) screening. You may need this if you are a current or former smoker. Osteoporosis. You may be screened starting at age 53 if you are at high risk. Talk with your health care provider about your test results, treatment options, and if necessary, the need for more tests. Vaccines  Your health care provider may recommend certain vaccines, such as: Influenza  vaccine. This is recommended every year. Tetanus, diphtheria, and acellular pertussis  (Tdap, Td) vaccine. You may need a Td booster every 10 years. Zoster vaccine. You may need this after age 40. Pneumococcal 13-valent conjugate (PCV13) vaccine. One dose is recommended after age 73. Pneumococcal polysaccharide (PPSV23) vaccine. One dose is recommended after age 24. Talk to your health care provider about which screenings and vaccines you need and how often you need them. This information is not intended to replace advice given to you by your health care provider. Make sure you discuss any questions you have with your health care provider. Document Released: 08/26/2015 Document Revised: 04/18/2016 Document Reviewed: 05/31/2015 Elsevier Interactive Patient Education  2017 [place] Prevention in the Home Falls can cause injuries. They can happen to people of all ages. There are many things you can do to make your home safe and to help prevent falls. What can I do on the outside of my home? Regularly fix the edges of walkways and driveways and fix any cracks. Remove anything that might make you trip as you walk through a door, such as a raised step or threshold. Trim any bushes or trees on the path to your home. Use bright outdoor lighting. Clear any walking paths of anything that might make someone trip, such as rocks or tools. Regularly check to see if handrails are loose or broken. Make sure that both sides of any steps have handrails. Any raised decks and porches should have guardrails on the edges. Have any leaves, snow, or ice cleared regularly. Use sand or salt on walking paths during winter. Clean up any spills in your garage right away. This includes oil or grease spills. What can I do in the bathroom? Use night lights. Install grab bars by the toilet and in the tub and shower. Do not use towel bars as grab bars. Use non-skid mats or decals in the tub or shower. If you need to sit down in the shower, use a plastic, non-slip stool. Keep the floor dry. Clean  up any water that spills on the floor as soon as it happens. Remove soap buildup in the tub or shower regularly. Attach bath mats securely with double-sided non-slip rug tape. Do not have throw rugs and other things on the floor that can make you trip. What can I do in the bedroom? Use night lights. Make sure that you have a light by your bed that is easy to reach. Do not use any sheets or blankets that are too big for your bed. They should not hang down onto the floor. Have a firm chair that has side arms. You can use this for support while you get dressed. Do not have throw rugs and other things on the floor that can make you trip. What can I do in the kitchen? Clean up any spills right away. Avoid walking on wet floors. Keep items that you use a lot in easy-to-reach places. If you need to reach something above you, use a strong step stool that has a grab bar. Keep electrical cords out of the way. Do not use floor polish or wax that makes floors slippery. If you must use wax, use non-skid floor wax. Do not have throw rugs and other things on the floor that can make you trip. What can I do with my stairs? Do not leave any items on the stairs. Make sure that there are handrails on both sides of the stairs and use them. Fix  handrails that are broken or loose. Make sure that handrails are as long as the stairways. Check any carpeting to make sure that it is firmly attached to the stairs. Fix any carpet that is loose or worn. Avoid having throw rugs at the top or bottom of the stairs. If you do have throw rugs, attach them to the floor with carpet tape. Make sure that you have a light switch at the top of the stairs and the bottom of the stairs. If you do not have them, ask someone to add them for you. What else can I do to help prevent falls? Wear shoes that: Do not have high heels. Have rubber bottoms. Are comfortable and fit you well. Are closed at the toe. Do not wear sandals. If you  use a stepladder: Make sure that it is fully opened. Do not climb a closed stepladder. Make sure that both sides of the stepladder are locked into place. Ask someone to hold it for you, if possible. Clearly mark and make sure that you can see: Any grab bars or handrails. First and last steps. Where the edge of each step is. Use tools that help you move around (mobility aids) if they are needed. These include: Canes. Walkers. Scooters. Crutches. Turn on the lights when you go into a dark area. Replace any light bulbs as soon as they burn out. Set up your furniture so you have a clear path. Avoid moving your furniture around. If any of your floors are uneven, fix them. If there are any pets around you, be aware of where they are. Review your medicines with your doctor. Some medicines can make you feel dizzy. This can increase your chance of falling. Ask your doctor what other things that you can do to help prevent falls. This information is not intended to replace advice given to you by your health care provider. Make sure you discuss any questions you have with your health care provider. Document Released: 05/26/2009 Document Revised: 01/05/2016 Document Reviewed: 09/03/2014 Elsevier Interactive Patient Education  2017 [employer].

## 2021-10-05 ENCOUNTER — Other Ambulatory Visit: Payer: Self-pay

## 2021-10-05 DIAGNOSIS — Z86010 Personal history of colonic polyps: Secondary | ICD-10-CM

## 2021-10-05 DIAGNOSIS — Z800 Family history of malignant neoplasm of digestive organs: Secondary | ICD-10-CM

## 2021-10-05 MED ORDER — PEG 3350-KCL-NA BICARB-NACL 420 G PO SOLR: 4000.0000 mL | Freq: Once | ORAL | 0 refills | Status: AC

## 2021-10-05 NOTE — Progress Notes (Signed)
Gastroenterology Pre-Procedure Review  Request Date: 11/09/2021 Requesting Physician: Dr. Allen Norris  PATIENT REVIEW QUESTIONS: The patient responded to the following health history questions as indicated:    1. Are you having any GI issues? no 2. Do you have a personal history of Polyps? yes (03/2017 polyps removed.) 3. Do you have a family history of Colon Cancer or Polyps? yes (Mother- colon cancer) 4. Diabetes Mellitus? no 5. Joint replacements in the past 12 months?no 6. Major health problems in the past 3 months?no 7. Any artificial heart valves, MVP, or defibrillator?no    MEDICATIONS & ALLERGIES:    Patient reports the following regarding taking any anticoagulation/antiplatelet therapy:   Plavix, Coumadin, Eliquis, Xarelto, Lovenox, Pradaxa, Brilinta, or Effient? no Aspirin? yes (81 mg)  Patient confirms/reports the following medications:  Current Outpatient Medications  Medication Sig Dispense Refill   aspirin EC 81 MG tablet Take 81 mg by mouth daily.     atorvastatin (LIPITOR) 40 MG tablet Take 1 tablet (40 mg total) by mouth daily. 90 tablet 1   buPROPion ER (WELLBUTRIN SR) 100 MG 12 hr tablet Take 1 tablet (100 mg total) by mouth 2 (two) times daily. 180 tablet 1   cyclobenzaprine (FLEXERIL) 10 MG tablet Take 1 tablet (10 mg total) by mouth at bedtime. 30 tablet 5   meloxicam (MOBIC) 15 MG tablet Take 1 tablet (15 mg total) by mouth daily. 90 tablet 1   PARoxetine (PAXIL) 20 MG tablet Take 1 tablet (20 mg total) by mouth daily. 90 tablet 1   tamsulosin (FLOMAX) 0.4 MG CAPS capsule Take 1 capsule (0.4 mg total) by mouth daily. 90 capsule 1   valsartan (DIOVAN) 160 MG tablet Take 1 tablet (160 mg total) by mouth daily. 90 tablet 1   No current facility-administered medications for this visit.    Patient confirms/reports the following allergies:  No Known Allergies  No orders of the defined types were placed in this encounter.   AUTHORIZATION INFORMATION Primary  Insurance: 1D#: Group #:  Secondary Insurance: 1D#: Group #:  SCHEDULE INFORMATION: Date: 11/09/2021 Time: Location: [place]

## 2021-10-11 ENCOUNTER — Ambulatory Visit (INDEPENDENT_AMBULATORY_CARE_PROVIDER_SITE_OTHER): Payer: Medicare Other | Admitting: Family Medicine

## 2021-10-11 ENCOUNTER — Encounter: Payer: Self-pay | Admitting: Family Medicine

## 2021-10-11 ENCOUNTER — Other Ambulatory Visit: Payer: Self-pay

## 2021-10-11 VITALS — BP 136/88 | HR 80 | Ht 72.0 in | Wt 229.0 lb

## 2021-10-11 DIAGNOSIS — R7303 Prediabetes: Secondary | ICD-10-CM | POA: Diagnosis not present

## 2021-10-11 DIAGNOSIS — F32A Depression, unspecified: Secondary | ICD-10-CM

## 2021-10-11 DIAGNOSIS — E782 Mixed hyperlipidemia: Secondary | ICD-10-CM

## 2021-10-11 DIAGNOSIS — M542 Cervicalgia: Secondary | ICD-10-CM

## 2021-10-11 DIAGNOSIS — N401 Enlarged prostate with lower urinary tract symptoms: Secondary | ICD-10-CM

## 2021-10-11 DIAGNOSIS — I10 Essential (primary) hypertension: Secondary | ICD-10-CM | POA: Diagnosis not present

## 2021-10-11 DIAGNOSIS — F419 Anxiety disorder, unspecified: Secondary | ICD-10-CM | POA: Diagnosis not present

## 2021-10-11 DIAGNOSIS — R350 Frequency of micturition: Secondary | ICD-10-CM

## 2021-10-11 MED ORDER — TAMSULOSIN HCL 0.4 MG PO CAPS: 0.4000 mg | ORAL_CAPSULE | Freq: Every day | ORAL | 1 refills | Status: DC

## 2021-10-11 MED ORDER — BUPROPION HCL ER (SR) 100 MG PO TB12: 100.0000 mg | ORAL_TABLET | Freq: Two times a day (BID) | ORAL | 1 refills | Status: DC

## 2021-10-11 MED ORDER — PAROXETINE HCL 20 MG PO TABS: 20.0000 mg | ORAL_TABLET | Freq: Every day | ORAL | 1 refills | Status: DC

## 2021-10-11 MED ORDER — ATORVASTATIN CALCIUM 40 MG PO TABS: 40.0000 mg | ORAL_TABLET | Freq: Every day | ORAL | 1 refills | Status: DC

## 2021-10-11 MED ORDER — VALSARTAN 160 MG PO TABS: 160.0000 mg | ORAL_TABLET | Freq: Every day | ORAL | 1 refills | Status: DC

## 2021-10-11 MED ORDER — CYCLOBENZAPRINE HCL 10 MG PO TABS: 10.0000 mg | ORAL_TABLET | Freq: Every day | ORAL | 5 refills | Status: DC

## 2021-10-11 MED ORDER — MELOXICAM 15 MG PO TABS: 15.0000 mg | ORAL_TABLET | Freq: Every day | ORAL | 1 refills | Status: DC

## 2021-10-11 NOTE — Progress Notes (Signed)
Date:  10/11/2021   Name:  Dennis Navarro   DOB:  [birth year]   MRN:  [id number]   Chief Complaint: Hypertension, Benign Prostatic Hypertrophy, Depression, Hyperlipidemia, and Spasms  Hypertension This is a chronic problem. The current episode started more than 1 year ago. The problem has been gradually improving since onset. The problem is uncontrolled. Associated symptoms include neck pain. Pertinent negatives include no anxiety, blurred vision, chest pain, headaches, malaise/fatigue, orthopnea, palpitations, peripheral edema, PND, shortness of breath or sweats. Risk factors for coronary artery disease include dyslipidemia. Past treatments include angiotensin blockers. The current treatment provides moderate improvement. There are no compliance problems.  There is no history of angina, kidney disease, CAD/MI, CVA, heart failure, left ventricular hypertrophy, PVD or retinopathy. There is no history of chronic renal disease, a hypertension causing med or renovascular disease.  Benign Prostatic Hypertrophy This is a chronic problem. The current episode started more than 1 year ago. The problem has been gradually improving since onset. Irritative symptoms do not include frequency, nocturia or urgency. Obstructive symptoms do not include dribbling, incomplete emptying, an intermittent stream, a slower stream, straining or a weak stream. hesitancy. Associated symptoms include hesitancy. Pertinent negatives include no chills, dysuria, hematuria or nausea. Past treatments include tamsulosin. The treatment provided moderate relief.  Depression        This is a chronic problem.  The current episode started more than 1 year ago.   The problem has been gradually improving since onset.  Associated symptoms include no decreased concentration, no fatigue, no helplessness, no hopelessness, does not have insomnia, not irritable, no restlessness, no decreased interest, no appetite change, no body aches, no myalgias,  no headaches, no indigestion, not sad and no suicidal ideas.  Past treatments include other medications (welbutrin).  Compliance with treatment is good.  Past compliance problems include medication issues.  Previous treatment provided moderate relief.   Pertinent negatives include no hypothyroidism and no anxiety. Hyperlipidemia This is a chronic problem. The current episode started more than 1 year ago. The problem is controlled. Recent lipid tests were reviewed and are normal. He has no history of chronic renal disease, diabetes, hypothyroidism, liver disease, obesity or nephrotic syndrome. Pertinent negatives include no chest pain, focal sensory loss, focal weakness, leg pain, myalgias or shortness of breath. The current treatment provides moderate improvement of lipids. There are no compliance problems.   Diabetes He presents for his follow-up diabetic visit. Diabetes type: prediabetes. His disease course has been stable. Pertinent negatives for hypoglycemia include no confusion, dizziness, headaches, hunger, mood changes, nervousness/anxiousness, pallor, seizures, sleepiness, speech difficulty, sweats or tremors. Pertinent negatives for diabetes include no blurred vision, no chest pain, no fatigue, no polydipsia, no polyuria and no weakness. There are no hypoglycemic complications. Symptoms are stable. Pertinent negatives for diabetic complications include no CVA, PVD or retinopathy. Risk factors for coronary artery disease include dyslipidemia and diabetes mellitus. Current diabetic treatment includes diet.  Neck Pain  This is a chronic problem. The current episode started more than 1 year ago. The problem has been gradually improving. The pain is associated with nothing. Pertinent negatives include no chest pain, fever, headaches, leg pain or weakness. He has tried muscle relaxants and NSAIDs for the symptoms. The treatment provided moderate relief.   Lab Results  Component Value Date   NA 139  04/11/2021   K 4.3 04/11/2021   CO2 23 04/11/2021   GLUCOSE 126 (H) 04/11/2021   BUN 15 04/11/2021   CREATININE  0.78 04/11/2021   CALCIUM 9.4 04/11/2021   EGFR 97 04/11/2021   GFRNONAA 90 08/05/2019   Lab Results  Component Value Date   CHOL 169 10/10/2020   HDL 89 10/10/2020   LDLCALC 67 10/10/2020   TRIG 69 10/10/2020   CHOLHDL 2.2 01/08/2018   Lab Results  Component Value Date   TSH 1.920 05/06/2017   Lab Results  Component Value Date   HGBA1C 5.7 (H) 04/11/2021   Lab Results  Component Value Date   WBC 7.2 05/06/2017   HGB 14.1 05/06/2017   HCT 41.9 05/06/2017   MCV 92 05/06/2017   PLT 262 05/06/2017   Lab Results  Component Value Date   ALT 28 10/10/2020   AST 24 10/10/2020   ALKPHOS 53 10/10/2020   BILITOT 0.4 10/10/2020   No results found for: 25OHVITD2, 25OHVITD3, VD25OH   Review of Systems  Constitutional:  Negative for appetite change, chills, fatigue, fever and malaise/fatigue.  HENT:  Negative for drooling, ear discharge, ear pain and sore throat.   Eyes:  Negative for blurred vision.  Respiratory:  Negative for cough, shortness of breath and wheezing.   Cardiovascular:  Negative for chest pain, palpitations, orthopnea, leg swelling and PND.  Gastrointestinal:  Negative for abdominal pain, blood in stool, constipation, diarrhea and nausea.  Endocrine: Negative for polydipsia and polyuria.  Genitourinary:  Positive for hesitancy. Negative for dysuria, frequency, hematuria, incomplete emptying, nocturia and urgency.  Musculoskeletal:  Positive for neck pain. Negative for back pain and myalgias.  Skin:  Negative for pallor and rash.  Allergic/Immunologic: Negative for environmental allergies.  Neurological:  Negative for dizziness, tremors, focal weakness, seizures, speech difficulty, weakness and headaches.  Hematological:  Does not bruise/bleed easily.  Psychiatric/Behavioral:  Positive for depression. Negative for confusion, decreased concentration  and suicidal ideas. The patient is not nervous/anxious and does not have insomnia.    Patient Active Problem List   Diagnosis Date Noted   Nocturia 09/12/2016   Essential hypertension 12/15/2015   Mixed hyperlipidemia 12/15/2015   Anxiety and depression 12/15/2015    No Known Allergies  Past Surgical History:  Procedure Laterality Date   APPENDECTOMY     COLONOSCOPY  2013   cleared for 5 yrs- Dr Vira Agar   HAND SURGERY Bilateral    HERNIA REPAIR     x 3   SEPTOPLASTY     SKIN CANCER EXCISION     under tongue   squamous cell cancer removal from chest      Social History   Tobacco Use   Smoking status: Former   Smokeless tobacco: Never   Tobacco comments:    quit 40 years  Vaping Use   Vaping Use: Never used  Substance Use Topics   Alcohol use: Yes   Drug use: No     Medication list has been reviewed and updated.  Current Meds  Medication Sig   aspirin EC 81 MG tablet Take 81 mg by mouth daily.   atorvastatin (LIPITOR) 40 MG tablet Take 1 tablet (40 mg total) by mouth daily.   buPROPion ER (WELLBUTRIN SR) 100 MG 12 hr tablet Take 1 tablet (100 mg total) by mouth 2 (two) times daily.   cyclobenzaprine (FLEXERIL) 10 MG tablet Take 1 tablet (10 mg total) by mouth at bedtime.   meloxicam (MOBIC) 15 MG tablet Take 1 tablet (15 mg total) by mouth daily.   PARoxetine (PAXIL) 20 MG tablet Take 1 tablet (20 mg total) by mouth daily.   tamsulosin (  FLOMAX) 0.4 MG CAPS capsule Take 1 capsule (0.4 mg total) by mouth daily.   valsartan (DIOVAN) 160 MG tablet Take 1 tablet (160 mg total) by mouth daily.    PHQ 2/9 Scores 10/11/2021 10/04/2021 04/11/2021 10/10/2020  PHQ - 2 Score 0 3 0 0  PHQ- 9 Score 0 3 0 0    GAD 7 : Generalized Anxiety Score 10/11/2021 04/11/2021 10/10/2020 02/04/2020  Nervous, Anxious, on Edge 0 0 0 0  Control/stop worrying 0 0 0 0  Worry too much - different things 0 0 0 0  Trouble relaxing 0 0 0 0  Restless 0 0 0 0  Easily annoyed or irritable 0 0 0 0   Afraid - awful might happen 0 0 0 0  Total GAD 7 Score 0 0 0 0  Anxiety Difficulty Not difficult at all - - Not difficult at all    BP Readings from Last 3 Encounters:  10/11/21 (!) 138/100  04/11/21 136/88  10/10/20 130/70    Physical Exam Vitals and nursing note reviewed.  Constitutional:      General: He is not irritable. HENT:     Head: Normocephalic.     Right Ear: Tympanic membrane, ear canal and external ear normal.     Left Ear: Tympanic membrane, ear canal and external ear normal.     Nose: Nose normal. No congestion or rhinorrhea.  Eyes:     General: No scleral icterus.       Right eye: No discharge.        Left eye: No discharge.     Conjunctiva/sclera: Conjunctivae normal.     Pupils: Pupils are equal, round, and reactive to light.  Neck:     Thyroid: No thyromegaly.     Vascular: No carotid bruit or JVD.     Trachea: No tracheal deviation.  Cardiovascular:     Rate and Rhythm: Normal rate and regular rhythm.     Heart sounds: Normal heart sounds. No murmur heard.   No friction rub. No gallop.  Pulmonary:     Effort: No respiratory distress.     Breath sounds: Normal breath sounds. No wheezing, rhonchi or rales.  Abdominal:     General: Bowel sounds are normal.     Palpations: Abdomen is soft. There is no mass.     Tenderness: There is no abdominal tenderness. There is no guarding or rebound.  Musculoskeletal:        General: No tenderness. Normal range of motion.     Cervical back: Normal range of motion and neck supple.  Lymphadenopathy:     Cervical: No cervical adenopathy.  Skin:    General: Skin is warm.     Findings: No rash.  Neurological:     Mental Status: He is alert and oriented to person, place, and time.     Cranial Nerves: No cranial nerve deficit.     Deep Tendon Reflexes: Reflexes are normal and symmetric.    Wt Readings from Last 3 Encounters:  10/11/21 229 lb (103.9 kg)  04/11/21 218 lb (98.9 kg)  10/10/20 219 lb (99.3 kg)     BP (!) 138/100    Pulse 80    Ht 6' (1.829 m)    Wt 229 lb (103.9 kg)    BMI 31.06 kg/m   Assessment and Plan:  1. Essential hypertension Chronic.  Controlled.  Stable.  Blood pressure is 136/88.  We will check comprehensive metabolic panel for electrolytes and GFR.  And  we will continue valsartan 160 mg once a day. - valsartan (DIOVAN) 160 MG tablet; Take 1 tablet (160 mg total) by mouth daily.  Dispense: 90 tablet; Refill: 1 - Comprehensive Metabolic Panel (CMET)  2. Mixed hyperlipidemia Chronic.  Controlled.  Stable.  Continue atorvastatin 40 mg once a day.  Will check lipid panel for current level of LDL. - atorvastatin (LIPITOR) 40 MG tablet; Take 1 tablet (40 mg total) by mouth daily.  Dispense: 90 tablet; Refill: 1 - Comprehensive Metabolic Panel (CMET) - Lipid Panel With LDL/HDL Ratio  3. Prediabetes New onset.  Persistent.  Stable.  Patient is gradually returning to normal levels we will check A1c for current level of A1c - HgB A1c - Comprehensive Metabolic Panel (CMET)  4. Anxiety and depression Chronic.  Controlled.  Stable.  Continue bupropion SR 100 mg twice a day and Paxil 20 mg once a day.  PHQ is 0 and gad score is 0. - buPROPion ER (WELLBUTRIN SR) 100 MG 12 hr tablet; Take 1 tablet (100 mg total) by mouth 2 (two) times daily.  Dispense: 180 tablet; Refill: 1 - PARoxetine (PAXIL) 20 MG tablet; Take 1 tablet (20 mg total) by mouth daily.  Dispense: 90 tablet; Refill: 1  5. Cervicalgia Chronic.  Persistent.  Episodic.  Primarily at night patient will take his Mobic meloxicam 15 mg and cyclobenzaprine 10 mg nightly. - cyclobenzaprine (FLEXERIL) 10 MG tablet; Take 1 tablet (10 mg total) by mouth at bedtime.  Dispense: 30 tablet; Refill: 5 - meloxicam (MOBIC) 15 MG tablet; Take 1 tablet (15 mg total) by mouth daily.  Dispense: 90 tablet; Refill: 1  6. Benign prostatic hyperplasia with urinary frequency Chronic.  Controlled.  Stable.  Other than hesitancy patient is  asymptomatic and is tolerating tamsulosin 0.4 mg once a day. - tamsulosin (FLOMAX) 0.4 MG CAPS capsule; Take 1 capsule (0.4 mg total) by mouth daily.  Dispense: 90 capsule; Refill: 1

## 2021-10-12 LAB — LIPID PANEL WITH LDL/HDL RATIO
Cholesterol, Total: 201 mg/dL — ABNORMAL HIGH (ref 100–199)
HDL: 82 mg/dL (ref >39)
LDL Chol Calc (NIH): 101 mg/dL — ABNORMAL HIGH (ref 0–99)
LDL/HDL Ratio: 1.2 ratio (ref 0.0–3.6)
Triglycerides: 101 mg/dL (ref 0–149)
VLDL Cholesterol Cal: 18 mg/dL (ref 5–40)

## 2021-10-12 LAB — HEMOGLOBIN A1C
Est. average glucose Bld gHb Est-mCnc: 123 mg/dL
Hgb A1c MFr Bld: 5.9 % — ABNORMAL HIGH (ref 4.8–5.6)

## 2021-10-12 LAB — COMPREHENSIVE METABOLIC PANEL
ALT: 36 IU/L (ref 0–44)
AST: 24 IU/L (ref 0–40)
Albumin/Globulin Ratio: 2.6 — ABNORMAL HIGH (ref 1.2–2.2)
Albumin: 4.6 g/dL (ref 3.8–4.8)
Alkaline Phosphatase: 51 IU/L (ref 44–121)
BUN/Creatinine Ratio: 19 (ref 10–24)
BUN: 17 mg/dL (ref 8–27)
Bilirubin Total: 0.5 mg/dL (ref 0.0–1.2)
CO2: 23 mmol/L (ref 20–29)
Calcium: 9.5 mg/dL (ref 8.6–10.2)
Chloride: 102 mmol/L (ref 96–106)
Creatinine, Ser: 0.9 mg/dL (ref 0.76–1.27)
Globulin, Total: 1.8 g/dL (ref 1.5–4.5)
Glucose: 122 mg/dL — ABNORMAL HIGH (ref 70–99)
Potassium: 5 mmol/L (ref 3.5–5.2)
Sodium: 141 mmol/L (ref 134–144)
Total Protein: 6.4 g/dL (ref 6.0–8.5)
eGFR: 92 mL/min/{1.73_m2} (ref >59)

## 2021-10-28 ENCOUNTER — Other Ambulatory Visit: Payer: Self-pay | Admitting: Family Medicine

## 2021-10-28 DIAGNOSIS — M542 Cervicalgia: Secondary | ICD-10-CM

## 2021-10-30 NOTE — Telephone Encounter (Addendum)
Called pt to verify that the Rx refilled 10/11/2020 was available. Medication has been picked up by pt. Refill not needed at this time. ? ?Requested Prescriptions  ?Signed Prescriptions Disp Refills  ? meloxicam (MOBIC) 15 MG tablet 30 tablet 0  ?  Sig: TAKE 1 TABLET (15 MG TOTAL) BY MOUTH DAILY.  ?  ? Analgesics:  COX2 Inhibitors Failed - 10/28/2021  1:16 AM  ?  ?  Failed - Manual Review: Labs are only required if the patient has taken medication for more than 8 weeks.  ?  ?  Failed - HGB in normal range and within 360 days  ?  Hemoglobin  ?Date Value Ref Range Status  ?05/06/2017 14.1 13.0 - 17.7 g/dL Final  ?  ?  ?  ?  Failed - HCT in normal range and within 360 days  ?  Hematocrit  ?Date Value Ref Range Status  ?05/06/2017 41.9 37.5 - 51.0 % Final  ?  ?  ?  ?  Passed - Cr in normal range and within 360 days  ?  Creatinine  ?Date Value Ref Range Status  ?09/06/2011 0.94 0.60 - 1.30 mg/dL Final  ? ?Creatinine, Ser  ?Date Value Ref Range Status  ?10/11/2021 0.90 0.76 - 1.27 mg/dL Final  ?  ?  ?  ?  Passed - AST in normal range and within 360 days  ?  AST  ?Date Value Ref Range Status  ?10/11/2021 24 0 - 40 IU/L Final  ?  ?  ?  ?  Passed - ALT in normal range and within 360 days  ?  ALT  ?Date Value Ref Range Status  ?10/11/2021 36 0 - 44 IU/L Final  ?  ?  ?  ?  Passed - eGFR is 30 or above and within 360 days  ?  EGFR (African American)  ?Date Value Ref Range Status  ?09/06/2011 >60 >76mL/min Final  ? ?GFR calc Af Wyvonnia Lora  ?Date Value Ref Range Status  ?08/05/2019 105 >59 mL/min/1.73 Final  ? ?EGFR (Non-African Amer.)  ?Date Value Ref Range Status  ?09/06/2011 >60 >32mL/min Final  ?  Comment:  ?  eGFR values <15mL/min/1.73 m2 may be an indication of chronic ?kidney disease (CKD). ?Calculated eGFR, using the MRDR Study equation, is useful in  ?patients with stable renal function. ?The eGFR calculation will not be reliable in acutely ill patients ?when serum creatinine is changing rapidly. It is not useful in ?patients on  dialysis. The eGFR calculation may not be applicable ?to patients at the low and high extremes of body sizes, pregnant ?women, and vegetarians. ?  ? ?GFR calc non Af Amer  ?Date Value Ref Range Status  ?08/05/2019 90 >59 mL/min/1.73 Final  ? ?eGFR  ?Date Value Ref Range Status  ?10/11/2021 92 >59 mL/min/1.73 Final  ?  ?  ?  ?  Passed - Patient is not pregnant  ?  ?  Passed - Valid encounter within last 12 months  ?  Recent Outpatient Visits   ? ?      ? 2 weeks ago Essential hypertension  ? [facility] Juline Patch, MD  ? 6 months ago Essential hypertension  ? [facility] Juline Patch, MD  ? 1 year ago Essential hypertension  ? [facility] Juline Patch, MD  ? 1 year ago Mixed hyperlipidemia  ? [facility] Juline Patch, MD  ? 2 years ago Essential hypertension  ? [facility]  Juline Patch, MD  ? ?  ?  ?Future Appointments   ? ?        ? In 5 months Juline Patch, MD [facility], [place]  ? ?  ? ?  ?  ?  ?  ?

## 2021-11-01 ENCOUNTER — Encounter: Payer: Self-pay | Admitting: Gastroenterology

## 2021-11-09 ENCOUNTER — Ambulatory Visit: Payer: Medicare Other | Admitting: Anesthesiology

## 2021-11-09 ENCOUNTER — Encounter
Admission: RE | Disposition: A | Discharge status: Home / Self Care | Payer: Self-pay | Source: Home / Self Care | Attending: Gastroenterology

## 2021-11-09 ENCOUNTER — Other Ambulatory Visit: Payer: Self-pay

## 2021-11-09 ENCOUNTER — Ambulatory Visit
Admission: RE | Admit: 2021-11-09 | Discharge: 2021-11-09 | Disposition: A | Discharge status: Home / Self Care | Payer: Medicare Other | Attending: Gastroenterology | Admitting: Gastroenterology

## 2021-11-09 ENCOUNTER — Encounter: Payer: Self-pay | Admitting: Gastroenterology

## 2021-11-09 DIAGNOSIS — Z800 Family history of malignant neoplasm of digestive organs: Secondary | ICD-10-CM | POA: Insufficient documentation

## 2021-11-09 DIAGNOSIS — Z1211 Encounter for screening for malignant neoplasm of colon: Secondary | ICD-10-CM | POA: Diagnosis not present

## 2021-11-09 DIAGNOSIS — Z79899 Other long term (current) drug therapy: Secondary | ICD-10-CM | POA: Insufficient documentation

## 2021-11-09 DIAGNOSIS — K635 Polyp of colon: Secondary | ICD-10-CM

## 2021-11-09 DIAGNOSIS — K640 First degree hemorrhoids: Secondary | ICD-10-CM | POA: Diagnosis not present

## 2021-11-09 DIAGNOSIS — I10 Essential (primary) hypertension: Secondary | ICD-10-CM | POA: Insufficient documentation

## 2021-11-09 DIAGNOSIS — K5730 Diverticulosis of large intestine without perforation or abscess without bleeding: Secondary | ICD-10-CM | POA: Diagnosis not present

## 2021-11-09 DIAGNOSIS — Z86010 Personal history of colonic polyps: Secondary | ICD-10-CM

## 2021-11-09 DIAGNOSIS — G4730 Sleep apnea, unspecified: Secondary | ICD-10-CM | POA: Insufficient documentation

## 2021-11-09 DIAGNOSIS — Z87891 Personal history of nicotine dependence: Secondary | ICD-10-CM | POA: Insufficient documentation

## 2021-11-09 DIAGNOSIS — D124 Benign neoplasm of descending colon: Secondary | ICD-10-CM | POA: Diagnosis not present

## 2021-11-09 DIAGNOSIS — D126 Benign neoplasm of colon, unspecified: Secondary | ICD-10-CM | POA: Diagnosis not present

## 2021-11-09 HISTORY — PX: POLYPECTOMY: SHX5525

## 2021-11-09 HISTORY — PX: COLONOSCOPY WITH PROPOFOL: SHX5780

## 2021-11-09 HISTORY — DX: Presence of dental prosthetic device (complete) (partial): Z97.2

## 2021-11-09 SURGERY — COLONOSCOPY WITH PROPOFOL
Anesthesia: General | Site: Rectum

## 2021-11-09 MED ORDER — SODIUM CHLORIDE 0.9 % IV SOLN: INTRAVENOUS | Status: DC

## 2021-11-09 MED ORDER — STERILE WATER FOR IRRIGATION IR SOLN
Status: DC | PRN
  Administered 2021-11-09: 250 mL

## 2021-11-09 MED ORDER — PROPOFOL 10 MG/ML IV BOLUS
INTRAVENOUS | Status: DC | PRN
  Administered 2021-11-09: 20 mg via INTRAVENOUS
  Administered 2021-11-09: 100 mg via INTRAVENOUS
  Administered 2021-11-09: 40 mg via INTRAVENOUS
  Administered 2021-11-09 (×5): 20 mg via INTRAVENOUS

## 2021-11-09 MED ORDER — LACTATED RINGERS IV SOLN: INTRAVENOUS | Status: DC

## 2021-11-09 MED ORDER — LIDOCAINE HCL (CARDIAC) PF 100 MG/5ML IV SOSY
PREFILLED_SYRINGE | INTRAVENOUS | Status: DC | PRN
  Administered 2021-11-09: 30 mg via INTRAVENOUS

## 2021-11-09 SURGICAL SUPPLY — 9 items
FORCEPS BIOP RAD 4 LRG CAP 4 (CUTTING FORCEPS) ×1 IMPLANT
GOWN CVR UNV OPN BCK APRN NK (MISCELLANEOUS) ×4 IMPLANT
GOWN ISOL THUMB LOOP REG UNIV (MISCELLANEOUS) ×6
KIT PRC NS LF DISP ENDO (KITS) ×2 IMPLANT
KIT PROCEDURE OLYMPUS (KITS) ×3
MANIFOLD NEPTUNE II (INSTRUMENTS) ×3 IMPLANT
SNARE COLD EXACTO (MISCELLANEOUS) IMPLANT
TRAP ETRAP POLY (MISCELLANEOUS) IMPLANT
WATER STERILE IRR 250ML POUR (IV SOLUTION) ×3 IMPLANT

## 2021-11-09 NOTE — Op Note (Signed)
[facility] ?Gastroenterology ?Patient Name: Dennis Navarro ?Procedure Date: 11/09/2021 7:07 AM ?MRN: [id number] ?Account #: [id number] ?Date of Birth: 1951/03/06 ?Admit Type: Outpatient ?Age: 71 ?Room: [facility] OR ROOM 01 ?Gender: Male ?Note Status: Finalized ?Instrument Name: [id number] ?Procedure:             Colonoscopy ?Indications:           Screening for colorectal malignant neoplasm, Family  ?                       history of colon cancer in a first-degree relative  ?                       before age 46 years ?Providers:             Lucilla Lame MD, MD ?Referring MD:          Juline Patch, MD (Referring MD) ?Medicines:             Propofol per Anesthesia ?Complications:         No immediate complications. ?Procedure:             Pre-Anesthesia Assessment: ?                       - Prior to the procedure, a History and Physical was  ?                       performed, and patient medications and allergies were  ?                       reviewed. The patient's tolerance of previous  ?                       anesthesia was also reviewed. The risks and benefits  ?                       of the procedure and the sedation options and risks  ?                       were discussed with the patient. All questions were  ?                       answered, and informed consent was obtained. Prior  ?                       Anticoagulants: The patient has taken no previous  ?                       anticoagulant or antiplatelet agents. ASA Grade  ?                       Assessment: II - A patient with mild systemic disease.  ?                       After reviewing the risks and benefits, the patient  ?                       was deemed in satisfactory condition to undergo the  ?  procedure. ?                       After obtaining informed consent, the colonoscope was  ?                       passed under direct vision. Throughout the procedure,  ?                       the patient's blood  pressure, pulse, and oxygen  ?                       saturations were monitored continuously. The was  ?                       introduced through the anus and advanced to the the  ?                       cecum, identified by appendiceal orifice and ileocecal  ?                       valve. The colonoscopy was performed without  ?                       difficulty. The patient tolerated the procedure well.  ?                       The quality of the bowel preparation was excellent. ?Findings: ?     The perianal and digital rectal examinations were normal. ?     A 3 mm polyp was found in the descending colon. The polyp was sessile.  ?     The polyp was removed with a cold biopsy forceps. Resection and  ?     retrieval were complete. ?     Multiple small-mouthed diverticula were found in the sigmoid colon. ?     Non-bleeding internal hemorrhoids were found during retroflexion. The  ?     hemorrhoids were Grade I (internal hemorrhoids that do not prolapse). ?Impression:            - One 3 mm polyp in the descending colon, removed with  ?                       a cold biopsy forceps. Resected and retrieved. ?                       - Diverticulosis in the sigmoid colon. ?                       - Non-bleeding internal hemorrhoids. ?Recommendation:        - Discharge patient to home. ?                       - Resume previous diet. ?                       - Continue present medications. ?                       - Await pathology results. ?                       -  Repeat colonoscopy in 5 years for surveillance. ?Procedure Code(s):     --- Professional --- ?                       [phone], Colonoscopy, flexible; with biopsy, single or  ?                       multiple ?Diagnosis Code(s):     --- Professional --- ?                       Z12.11, Encounter for screening for malignant neoplasm  ?                       of colon ?                       K63.5, Polyp of colon ?CPT copyright 2019 [employer]. All rights  reserved. ?The codes documented in this report are preliminary and upon coder review may  ?be revised to meet current compliance requirements. ?Lucilla Lame MD, MD ?11/09/2021 7:57:14 AM ?This report has been signed electronically. ?Number of Addenda: 0 ?Note Initiated On: 11/09/2021 7:07 AM ?Scope Withdrawal Time: 0 hours 14 minutes 18 seconds  ?Total Procedure Duration: 0 hours 18 minutes 43 seconds  ?Estimated Blood Loss:  Estimated blood loss: none. ?     [facility] ?

## 2021-11-09 NOTE — Transfer of Care (Signed)
Immediate Anesthesia Transfer of Care Note ? ?Patient: Dennis Navarro ? ?Procedure(s) Performed: COLONOSCOPY WITH PROPOFOL (Rectum) ?POLYPECTOMY (Rectum) ? ?Patient Location: PACU ? ?Anesthesia Type: General ? ?Level of Consciousness: awake, alert  and patient cooperative ? ?Airway and Oxygen Therapy: Patient Spontanous Breathing and Patient connected to supplemental oxygen ? ?Post-op Assessment: Post-op Vital signs reviewed, Patient's Cardiovascular Status Stable, Respiratory Function Stable, Patent Airway and No signs of Nausea or vomiting ? ?Post-op Vital Signs: Reviewed and stable ? ?Complications: No notable events documented. ? ?

## 2021-11-09 NOTE — Anesthesia Procedure Notes (Signed)
Date/Time: 11/09/2021 7:32 AM ?Performed by: Cameron Ali, CRNA ?Pre-anesthesia Checklist: Patient identified, Emergency Drugs available, Suction available, Timeout performed and Patient being monitored ?Patient Re-evaluated:Patient Re-evaluated prior to induction ?Oxygen Delivery Method: Nasal cannula ?Placement Confirmation: positive ETCO2 ? ? ? ? ?

## 2021-11-09 NOTE — Anesthesia Postprocedure Evaluation (Signed)
Anesthesia Post Note ? ?Patient: Dennis Navarro ? ?Procedure(s) Performed: COLONOSCOPY WITH PROPOFOL (Rectum) ?POLYPECTOMY (Rectum) ? ? ?  ?Patient location during evaluation: PACU ?Anesthesia Type: General ?Level of consciousness: awake and alert ?Pain management: pain level controlled ?Vital Signs Assessment: post-procedure vital signs reviewed and stable ?Respiratory status: spontaneous breathing, nonlabored ventilation, respiratory function stable and patient connected to nasal cannula oxygen ?Cardiovascular status: blood pressure returned to baseline and stable ?Postop Assessment: no apparent nausea or vomiting ?Anesthetic complications: no ? ? ?No notable events documented. ? ?Adele Barthel Nekia Maxham ? ? ? ? ? ?

## 2021-11-09 NOTE — Anesthesia Preprocedure Evaluation (Signed)
Anesthesia Evaluation  ?Patient identified by MRN, date of birth, ID band ?Patient awake ? ? ? ?History of Anesthesia Complications ?Negative for: history of anesthetic complications ? ?Airway ?Mallampati: IV ? ?TM Distance: >3 FB ?Neck ROM: Full ? ? ? Dental ?no notable dental hx. ? ?  ?Pulmonary ?sleep apnea and Continuous Positive Airway Pressure Ventilation , former smoker,  ?  ?Pulmonary exam normal ? ? ? ? ? ? ? Cardiovascular ?Exercise Tolerance: Good ?hypertension, Pt. on medications ?Normal cardiovascular exam ? ? ?  ?Neuro/Psych ?negative neurological ROS ?   ? GI/Hepatic ?negative GI ROS, Neg liver ROS,   ?Endo/Other  ?negative endocrine ROS ? Renal/GU ?negative Renal ROS  ? ?  ?Musculoskeletal ? ? Abdominal ?  ?Peds ? Hematology ?negative hematology ROS ?(+)   ?Anesthesia Other Findings ? ? Reproductive/Obstetrics ? ?  ? ? ? ? ? ? ? ? ? ? ? ? ? ?  ?  ? ? ? ? ? ? ? ? ?Anesthesia Physical ?Anesthesia Plan ? ?ASA: 2 ? ?Anesthesia Plan: General  ? ?Post-op Pain Management: Minimal or no pain anticipated  ? ?Induction:  ? ?PONV Risk Score and Plan: Propofol infusion, TIVA and Treatment may vary due to age or medical condition ? ?Airway Management Planned: Nasal Cannula and Natural Airway ? ?Additional Equipment: None ? ?Intra-op Plan:  ? ?Post-operative Plan:  ? ?Informed Consent: I have reviewed the patients History and Physical, chart, labs and discussed the procedure including the risks, benefits and alternatives for the proposed anesthesia with the patient or authorized representative who has indicated his/her understanding and acceptance.  ? ? ? ? ? ?Plan Discussed with: CRNA ? ?Anesthesia Plan Comments:   ? ? ? ? ? ? ?Anesthesia Quick Evaluation ? ?

## 2021-11-09 NOTE — H&P (Signed)
? ?Lucilla Lame, MD [facility] ?[address] ?[place], [place] [address] ?Phone:[phone] ?Fax : [phone] ? ?Primary Care Physician:  Juline Patch, MD ?Primary Gastroenterologist:  Dr. Allen Norris ? ?Pre-Procedure History & Physical: ?HPI:  Dennis Navarro is a 71 y.o. male is here for an colonoscopy. ?  ?Past Medical History:  ?Diagnosis Date  ? Anxiety   ? BPH (benign prostatic hyperplasia)   ? Depression   ? Hyperlipidemia   ? Hypertension   ? Skin cancer   ? Sleep apnea   ? CPAP  ? Wears dentures   ? partial upper  ? ? ?Past Surgical History:  ?Procedure Laterality Date  ? APPENDECTOMY    ? COLONOSCOPY  2013  ? cleared for 5 yrs- Dr Vira Agar  ? HAND SURGERY Bilateral   ? HERNIA REPAIR    ? x 3  ? SEPTOPLASTY    ? SKIN CANCER EXCISION    ? under tongue  ? squamous cell cancer removal from chest    ? ? ?Prior to Admission medications   ?Medication Sig Start Date End Date Taking? Authorizing Provider  ?aspirin EC 81 MG tablet Take 81 mg by mouth daily.   Yes [provider]  ?atorvastatin (LIPITOR) 40 MG tablet Take 1 tablet (40 mg total) by mouth daily. 10/11/21  Yes Juline Patch, MD  ?buPROPion ER [facility] SR) 100 MG 12 hr tablet Take 1 tablet (100 mg total) by mouth 2 (two) times daily. 10/11/21  Yes Juline Patch, MD  ?cyclobenzaprine (FLEXERIL) 10 MG tablet Take 1 tablet (10 mg total) by mouth at bedtime. 10/11/21  Yes Juline Patch, MD  ?meloxicam (MOBIC) 15 MG tablet TAKE 1 TABLET (15 MG TOTAL) BY MOUTH DAILY. 10/30/21  Yes Juline Patch, MD  ?PARoxetine (PAXIL) 20 MG tablet Take 1 tablet (20 mg total) by mouth daily. 10/11/21  Yes Juline Patch, MD  ?tamsulosin (FLOMAX) 0.4 MG CAPS capsule Take 1 capsule (0.4 mg total) by mouth daily. 10/11/21  Yes Juline Patch, MD  ?valsartan (DIOVAN) 160 MG tablet Take 1 tablet (160 mg total) by mouth daily. 10/11/21  Yes Juline Patch, MD  ? ? ?Allergies as of 10/05/2021  ? (No Known Allergies)  ? ? ?Family History  ?Problem Relation Age of Onset  ?  Prostate cancer Neg Hx   ? Kidney cancer Neg Hx   ? Bladder Cancer Neg Hx   ? ? ?Social History  ? ?Socioeconomic History  ? Marital status: Married  ?  Spouse name: Not on file  ? Number of children: Not on file  ? Years of education: Not on file  ? Highest education level: Not on file  ?Occupational History  ? Not on file  ?Tobacco Use  ? Smoking status: Former  ?  Types: Cigarettes  ?  Quit date: 105  ?  Years since quitting: 46.2  ? Smokeless tobacco: Never  ? Tobacco comments:  ?  quit 40 years (smoked from age 61 to age 53)  ?Vaping Use  ? Vaping Use: Never used  ?Substance and Sexual Activity  ? Alcohol use: Yes  ?  Alcohol/week: 12.0 standard drinks  ?  Types: 12 Cans of beer per week  ? Drug use: No  ? Sexual activity: Yes  ?Other Topics Concern  ? Not on file  ?Social History Narrative  ? Not on file  ? ?Social Determinants of Health  ? ?Financial Resource Strain: Low Risk   ?  Difficulty of Paying Living Expenses: Not hard at all  ?Food Insecurity: No Food Insecurity  ? Worried About Charity fundraiser in the Last Year: Never true  ? Ran Out of Food in the Last Year: Never true  ?Transportation Needs: No Transportation Needs  ? Lack of Transportation (Medical): No  ? Lack of Transportation (Non-Medical): No  ?Physical Activity: Insufficiently Active  ? Days of Exercise per Week: 7 days  ? Minutes of Exercise per Session: 20 min  ?Stress: No Stress Concern Present  ? Feeling of Stress : Only a little  ?Social Connections: Moderately Integrated  ? Frequency of Communication with Friends and Family: More than three times a week  ? Frequency of Social Gatherings with Friends and Family: Three times a week  ? Attends Religious Services: More than 4 times per year  ? Active Member of Clubs or Organizations: No  ? Attends Archivist Meetings: Never  ? Marital Status: Married  ?Intimate Partner Violence: Not At Risk  ? Fear of Current or Ex-Partner: No  ? Emotionally Abused: No  ? Physically Abused: No   ? Sexually Abused: No  ? ? ?Review of Systems: ?See HPI, otherwise negative ROS ? ?Physical Exam: ?BP (!) 168/96   Pulse 63   Temp (!) 97 ?F (36.1 ?C) (Temporal)   Resp 20   Ht 6' (1.829 m)   Wt 101.6 kg   SpO2 97%   BMI 30.38 kg/m?  ?General:   Alert,  pleasant and cooperative in NAD ?Head:  Normocephalic and atraumatic. ?Neck:  Supple; no masses or thyromegaly. ?Lungs:  Clear throughout to auscultation.    ?Heart:  Regular rate and rhythm. ?Abdomen:  Soft, nontender and nondistended. Normal bowel sounds, without guarding, and without rebound.   ?Neurologic:  Alert and  oriented x4;  grossly normal neurologically. ? ?Impression/Plan: ?Dennis Navarro is here for an colonoscopy to be performed for screening ? ?Risks, benefits, limitations, and alternatives regarding  colonoscopy have been reviewed with the patient.  Questions have been answered.  All parties agreeable. ? ? ?Lucilla Lame, MD  11/09/2021, 7:22 AM ?

## 2021-11-10 ENCOUNTER — Encounter: Payer: Self-pay | Admitting: Gastroenterology

## 2021-11-10 LAB — SURGICAL PATHOLOGY

## 2021-11-13 ENCOUNTER — Encounter: Payer: Self-pay | Admitting: Gastroenterology

## 2022-03-19 DIAGNOSIS — H903 Sensorineural hearing loss, bilateral: Secondary | ICD-10-CM | POA: Diagnosis not present

## 2022-04-13 ENCOUNTER — Other Ambulatory Visit: Payer: Self-pay

## 2022-04-13 DIAGNOSIS — I10 Essential (primary) hypertension: Secondary | ICD-10-CM

## 2022-04-13 DIAGNOSIS — F32A Depression, unspecified: Secondary | ICD-10-CM

## 2022-04-13 MED ORDER — VALSARTAN 160 MG PO TABS: 160.0000 mg | ORAL_TABLET | Freq: Every day | ORAL | 0 refills | Status: DC

## 2022-04-13 MED ORDER — BUPROPION HCL ER (SR) 100 MG PO TB12: 100.0000 mg | ORAL_TABLET | Freq: Two times a day (BID) | ORAL | 0 refills | Status: DC

## 2022-04-15 ENCOUNTER — Other Ambulatory Visit: Payer: Self-pay | Admitting: Family Medicine

## 2022-04-15 DIAGNOSIS — E782 Mixed hyperlipidemia: Secondary | ICD-10-CM

## 2022-04-17 ENCOUNTER — Other Ambulatory Visit: Payer: Self-pay | Admitting: Family Medicine

## 2022-04-17 ENCOUNTER — Ambulatory Visit: Payer: Medicare Other | Admitting: Family Medicine

## 2022-04-17 DIAGNOSIS — F32A Depression, unspecified: Secondary | ICD-10-CM

## 2022-04-17 DIAGNOSIS — N401 Enlarged prostate with lower urinary tract symptoms: Secondary | ICD-10-CM

## 2022-04-17 NOTE — Telephone Encounter (Signed)
Requested Prescriptions  Pending Prescriptions Disp Refills  . atorvastatin (LIPITOR) 40 MG tablet [Pharmacy Med Name: ATORVASTATIN '40MG'$  TABLETS] 90 tablet 1    Sig: TAKE 1 TABLET(40 MG) BY MOUTH DAILY     Cardiovascular:  Antilipid - Statins Failed - 04/15/2022 10:37 AM      Failed - Lipid Panel in normal range within the last 12 months    Cholesterol, Total  Date Value Ref Range Status  10/11/2021 201 (H) 100 - 199 mg/dL Final   LDL Chol Calc (NIH)  Date Value Ref Range Status  10/11/2021 101 (H) 0 - 99 mg/dL Final   HDL  Date Value Ref Range Status  10/11/2021 82 >39 mg/dL Final   Triglycerides  Date Value Ref Range Status  10/11/2021 101 0 - 149 mg/dL Final         Passed - Patient is not pregnant      Passed - Valid encounter within last 12 months    Recent Outpatient Visits          6 months ago Essential hypertension   [place] [facility] at [place], Deanna C, MD   1 year ago Essential hypertension   [place] [facility] at [place], Deanna C, MD   1 year ago Essential hypertension   [place] [facility] at [place], Deanna C, MD   2 years ago Mixed hyperlipidemia   [place] [facility] at [place], Deanna C, MD   2 years ago Essential hypertension   [place] [facility] at [place], [place], MD      Future Appointments            In 2 days Juline Patch, MD [facility] at [facility], [facility]

## 2022-04-18 NOTE — Telephone Encounter (Signed)
Requested Prescriptions  Pending Prescriptions Disp Refills  . PARoxetine (PAXIL) 20 MG tablet [Pharmacy Med Name: PAROXETINE '20MG'$  TABLETS] 90 tablet 0    Sig: TAKE 1 TABLET(20 MG) BY MOUTH DAILY     Psychiatry:  Antidepressants - SSRI Failed - 04/17/2022  8:43 AM      Failed - Valid encounter within last 6 months    Recent Outpatient Visits          6 months ago Essential hypertension   [place] [facility] at [place], Deanna C, MD   1 year ago Essential hypertension   [place] [facility] Sports Medicine at [place], Deanna C, MD   1 year ago Essential hypertension   [place] [facility] at [place], Deanna C, MD   2 years ago Mixed hyperlipidemia   [place] [facility] at [place], Deanna C, MD   2 years ago Essential hypertension   [place] [facility] at [place], [place], MD      Future Appointments            Tomorrow Juline Patch, MD [facility] at [facility], [place] - Completed PHQ-2 or PHQ-9 in the last 360 days      . tamsulosin (FLOMAX) 0.4 MG CAPS capsule [Pharmacy Med Name: TAMSULOSIN 0.'4MG'$  CAPSULES] 90 capsule 1    Sig: TAKE 1 CAPSULE(0.4 MG) BY MOUTH DAILY     Urology: Alpha-Adrenergic Blocker Failed - 04/17/2022  8:43 AM      Failed - PSA in normal range and within 360 days    Prostate Specific Ag, Serum  Date Value Ref Range Status  10/10/2020 0.4 0.0 - 4.0 ng/mL Final    Comment:    Roche ECLIA methodology. According to the American Urological Association, Serum PSA should decrease and remain at undetectable levels after radical prostatectomy. The AUA defines biochemical recurrence as an initial PSA value 0.2 ng/mL or greater followed by a subsequent confirmatory PSA value 0.2 ng/mL or greater. Values obtained with different  assay methods or kits cannot be used interchangeably. Results cannot be interpreted as absolute evidence of the presence or absence of malignant disease.          Failed - Last BP in normal range    BP Readings from Last 1 Encounters:  11/09/21 (!) 142/87         Passed - Valid encounter within last 12 months    Recent Outpatient Visits          6 months ago Essential hypertension   [place] [facility] at [place], Deanna C, MD   1 year ago Essential hypertension   [place] [facility] at [place], Deanna C, MD   1 year ago Essential hypertension   [place] [facility] at [place], Deanna C, MD   2 years ago Mixed hyperlipidemia   [place] [facility] at [place], Deanna C, MD   2 years ago Essential hypertension    Primary Care and Sports Medicine at [place], [place], MD      Future Appointments            Tomorrow Ronnald Ramp,  Iven Finn, MD [place] [facility] at [facility], [facility]

## 2022-04-18 NOTE — Telephone Encounter (Signed)
Requested medication (s) are due for refill today: yes    Requested medication (s) are on the active medication list: yes    Last refill: 10/11/21    #90  1 refill  Future visit scheduled   Yes tomorrow 04/19/22  Notes to clinic: Failed due to labs, please review. Pt has appt tomorrow 9/7  Requested Prescriptions  Pending Prescriptions Disp Refills   tamsulosin (FLOMAX) 0.4 MG CAPS capsule [Pharmacy Med Name: TAMSULOSIN 0.'4MG'$  CAPSULES] 90 capsule 1    Sig: TAKE 1 CAPSULE(0.4 MG) BY MOUTH DAILY     Urology: Alpha-Adrenergic Blocker Failed - 04/17/2022  8:43 AM      Failed - PSA in normal range and within 360 days    Prostate Specific Ag, Serum  Date Value Ref Range Status  10/10/2020 0.4 0.0 - 4.0 ng/mL Final    Comment:    Roche ECLIA methodology. According to the American Urological Association, Serum PSA should decrease and remain at undetectable levels after radical prostatectomy. The AUA defines biochemical recurrence as an initial PSA value 0.2 ng/mL or greater followed by a subsequent confirmatory PSA value 0.2 ng/mL or greater. Values obtained with different assay methods or kits cannot be used interchangeably. Results cannot be interpreted as absolute evidence of the presence or absence of malignant disease.          Failed - Last BP in normal range    BP Readings from Last 1 Encounters:  11/09/21 (!) 142/87         Passed - Valid encounter within last 12 months    Recent Outpatient Visits           6 months ago Essential hypertension   [place] [facility] at [place], Deanna C, MD   1 year ago Essential hypertension   [place] and Sports Medicine at [place], Deanna C, MD   1 year ago Essential hypertension   [place] [facility] at [place], Deanna C, MD   2 years ago Mixed hyperlipidemia   [place] [facility] at [place], Deanna C, MD   2 years ago Essential hypertension   [place] and Sports Medicine at [place], [place], MD       Future Appointments             Tomorrow Juline Patch, MD [facility] Primary Care and Sports Medicine at [facility], [facility]            Signed Prescriptions Disp Refills   PARoxetine (PAXIL) 20 MG tablet 90 tablet 0    Sig: TAKE 1 TABLET(20 MG) BY MOUTH DAILY     Psychiatry:  Antidepressants - SSRI Failed - 04/17/2022  8:43 AM      Failed - Valid encounter within last 6 months    Recent Outpatient Visits           6 months ago Essential hypertension   [place] [facility] at [place], Deanna C, MD   1 year ago Essential hypertension   [place] [facility] Sports Medicine at [place], Deanna C, MD   1 year ago Essential hypertension   [place] and Sports Medicine at [place], Deanna C, MD   2 years ago Mixed hyperlipidemia    Primary Care and Sports Medicine at [facility]  Edd Fabian, MD   2 years ago Essential hypertension   [place] [facility] at [facility], MD       Future Appointments             Tomorrow Juline Patch, MD [facility] Primary Care and Sports Medicine at [facility], [place] - Completed PHQ-2 or PHQ-9 in the last 360 days

## 2022-04-19 ENCOUNTER — Encounter: Payer: Self-pay | Admitting: Family Medicine

## 2022-04-19 ENCOUNTER — Ambulatory Visit (INDEPENDENT_AMBULATORY_CARE_PROVIDER_SITE_OTHER): Payer: Medicare Other | Admitting: Family Medicine

## 2022-04-19 VITALS — BP 146/100 | HR 80 | Ht 72.0 in | Wt 223.0 lb

## 2022-04-19 DIAGNOSIS — N401 Enlarged prostate with lower urinary tract symptoms: Secondary | ICD-10-CM

## 2022-04-19 DIAGNOSIS — I10 Essential (primary) hypertension: Secondary | ICD-10-CM

## 2022-04-19 DIAGNOSIS — E782 Mixed hyperlipidemia: Secondary | ICD-10-CM

## 2022-04-19 DIAGNOSIS — R7303 Prediabetes: Secondary | ICD-10-CM

## 2022-04-19 DIAGNOSIS — N529 Male erectile dysfunction, unspecified: Secondary | ICD-10-CM

## 2022-04-19 DIAGNOSIS — R233 Spontaneous ecchymoses: Secondary | ICD-10-CM

## 2022-04-19 DIAGNOSIS — Z23 Encounter for immunization: Secondary | ICD-10-CM | POA: Diagnosis not present

## 2022-04-19 DIAGNOSIS — R350 Frequency of micturition: Secondary | ICD-10-CM | POA: Diagnosis not present

## 2022-04-19 DIAGNOSIS — F419 Anxiety disorder, unspecified: Secondary | ICD-10-CM

## 2022-04-19 DIAGNOSIS — F32A Depression, unspecified: Secondary | ICD-10-CM

## 2022-04-19 MED ORDER — BUPROPION HCL ER (SR) 100 MG PO TB12: 100.0000 mg | ORAL_TABLET | Freq: Two times a day (BID) | ORAL | 0 refills | Status: DC

## 2022-04-19 MED ORDER — VALSARTAN 160 MG PO TABS: 160.0000 mg | ORAL_TABLET | Freq: Every day | ORAL | 1 refills | Status: DC

## 2022-04-19 MED ORDER — ATORVASTATIN CALCIUM 40 MG PO TABS: ORAL_TABLET | ORAL | 1 refills | Status: DC

## 2022-04-19 MED ORDER — AMLODIPINE BESYLATE 2.5 MG PO TABS: 2.5000 mg | ORAL_TABLET | Freq: Every day | ORAL | 0 refills | Status: DC

## 2022-04-19 MED ORDER — TAMSULOSIN HCL 0.4 MG PO CAPS: 0.4000 mg | ORAL_CAPSULE | Freq: Every day | ORAL | 1 refills | Status: DC

## 2022-04-19 MED ORDER — PAROXETINE HCL 20 MG PO TABS: ORAL_TABLET | ORAL | 0 refills | Status: DC

## 2022-04-19 MED ORDER — SILDENAFIL CITRATE 100 MG PO TABS: 50.0000 mg | ORAL_TABLET | Freq: Every day | ORAL | 5 refills | Status: DC | PRN

## 2022-04-19 NOTE — Progress Notes (Signed)
Date:  04/19/2022   Name:  Dennis Navarro   DOB:  1951-01-01   MRN:  [id number]   Chief Complaint: Hypertension, Prediabetes, Benign Prostatic Hypertrophy, Hyperlipidemia, Depression, and Erectile Dysfunction  Hypertension This is a chronic problem. The current episode started more than 1 year ago. The problem has been waxing and waning since onset. The problem is controlled. Pertinent negatives include no anxiety, blurred vision, chest pain, headaches, malaise/fatigue, neck pain, orthopnea, palpitations, peripheral edema, PND, shortness of breath or sweats. There are no associated agents to hypertension. Risk factors for coronary artery disease include dyslipidemia. Past treatments include angiotensin blockers. The current treatment provides moderate improvement. There are no compliance problems.  There is no history of CAD/MI or CVA. There is no history of chronic renal disease, a hypertension causing med or renovascular disease.  Benign Prostatic Hypertrophy This is a chronic problem. The current episode started more than 1 year ago. The problem has been gradually improving since onset. Irritative symptoms include nocturia. Irritative symptoms do not include frequency or urgency. Obstructive symptoms do not include incomplete emptying or a weak stream. Past treatments include tamsulosin. The treatment provided moderate relief.  Hyperlipidemia This is a chronic problem. The current episode started more than 1 year ago. The problem is controlled. Recent lipid tests were reviewed and are normal. He has no history of chronic renal disease. Pertinent negatives include no chest pain or shortness of breath. Current antihyperlipidemic treatment includes statins. The current treatment provides moderate improvement of lipids. There are no compliance problems.  Risk factors for coronary artery disease include dyslipidemia and hypertension.  Depression        This is a chronic problem.  The problem occurs  intermittently.  The problem has been gradually improving since onset.  Associated symptoms include hopelessness and appetite change.  Associated symptoms include no fatigue, no helplessness and no headaches.  Previous treatment provided moderate relief.   Pertinent negatives include no anxiety. Erectile Dysfunction The current episode started more than 1 year ago. The problem has been gradually improving since onset. Irritative symptoms include nocturia. Irritative symptoms do not include frequency or urgency. Obstructive symptoms do not include incomplete emptying or a weak stream.    Lab Results  Component Value Date   NA 141 10/11/2021   K 5.0 10/11/2021   CO2 23 10/11/2021   GLUCOSE 122 (H) 10/11/2021   BUN 17 10/11/2021   CREATININE 0.90 10/11/2021   CALCIUM 9.5 10/11/2021   EGFR 92 10/11/2021   GFRNONAA 90 08/05/2019   Lab Results  Component Value Date   CHOL 201 (H) 10/11/2021   HDL 82 10/11/2021   LDLCALC 101 (H) 10/11/2021   TRIG 101 10/11/2021   CHOLHDL 2.2 01/08/2018   Lab Results  Component Value Date   TSH 1.920 05/06/2017   Lab Results  Component Value Date   HGBA1C 5.9 (H) 10/11/2021   Lab Results  Component Value Date   WBC 7.2 05/06/2017   HGB 14.1 05/06/2017   HCT 41.9 05/06/2017   MCV 92 05/06/2017   PLT 262 05/06/2017   Lab Results  Component Value Date   ALT 36 10/11/2021   AST 24 10/11/2021   ALKPHOS 51 10/11/2021   BILITOT 0.5 10/11/2021   No results found for: "25OHVITD2", "25OHVITD3", "VD25OH"   Review of Systems  Constitutional:  Positive for appetite change. Negative for fatigue, malaise/fatigue and unexpected weight change.  HENT:  Negative for trouble swallowing.   Eyes:  Negative for blurred  vision and visual disturbance.  Respiratory:  Negative for chest tightness, shortness of breath and wheezing.   Cardiovascular:  Negative for chest pain, palpitations, orthopnea and PND.  Gastrointestinal:  Negative for abdominal pain and  blood in stool.  Genitourinary:  Positive for nocturia. Negative for frequency, incomplete emptying and urgency.  Musculoskeletal:  Negative for neck pain.  Skin:  Negative for color change.  Neurological:  Negative for headaches.  Hematological:  Negative for adenopathy. Bruises/bleeds easily.  Psychiatric/Behavioral:  Positive for depression.     Patient Active Problem List   Diagnosis Date Noted   Special screening for malignant neoplasms, colon    Polyp of descending colon    Nocturia 09/12/2016   Essential hypertension 12/15/2015   Mixed hyperlipidemia 12/15/2015   Anxiety and depression 12/15/2015    No Known Allergies  Past Surgical History:  Procedure Laterality Date   APPENDECTOMY     COLONOSCOPY  2013   cleared for 5 yrs- Dr Vira Agar   COLONOSCOPY WITH PROPOFOL N/A 11/09/2021   Procedure: COLONOSCOPY WITH PROPOFOL;  Surgeon: Lucilla Lame, MD;  Location: [place];  Service: Endoscopy;  Laterality: N/A;  sleep apnea   HAND SURGERY Bilateral    HERNIA REPAIR     x 3   POLYPECTOMY  11/09/2021   Procedure: POLYPECTOMY;  Surgeon: Lucilla Lame, MD;  Location: [place];  Service: Endoscopy;;   SEPTOPLASTY     SKIN CANCER EXCISION     under tongue   squamous cell cancer removal from chest      Social History   Tobacco Use   Smoking status: Former    Types: Cigarettes    Quit date: 1977    Years since quitting: 46.7   Smokeless tobacco: Never   Tobacco comments:    quit 40 years (smoked from age 32 to age 83)  64 Use   Vaping Use: Never used  Substance Use Topics   Alcohol use: Yes    Alcohol/week: 12.0 standard drinks of alcohol    Types: 12 Cans of beer per week   Drug use: No     Medication list has been reviewed and updated.  Current Meds  Medication Sig   aspirin EC 81 MG tablet Take 81 mg by mouth daily.   atorvastatin (LIPITOR) 40 MG tablet TAKE 1 TABLET(40 MG) BY MOUTH DAILY   buPROPion ER (WELLBUTRIN SR) 100 MG 12 hr  tablet Take 1 tablet (100 mg total) by mouth 2 (two) times daily.   cyclobenzaprine (FLEXERIL) 10 MG tablet Take 1 tablet (10 mg total) by mouth at bedtime.   meloxicam (MOBIC) 15 MG tablet TAKE 1 TABLET (15 MG TOTAL) BY MOUTH DAILY.   PARoxetine (PAXIL) 20 MG tablet TAKE 1 TABLET(20 MG) BY MOUTH DAILY   tamsulosin (FLOMAX) 0.4 MG CAPS capsule Take 1 capsule (0.4 mg total) by mouth daily.   valsartan (DIOVAN) 160 MG tablet Take 1 tablet (160 mg total) by mouth daily.       04/19/2022    8:01 AM 10/11/2021    8:30 AM 04/11/2021    8:35 AM 10/10/2020    8:15 AM  GAD 7 : Generalized Anxiety Score  Nervous, Anxious, on Edge 2 0 0 0  Control/stop worrying 0 0 0 0  Worry too much - different things 0 0 0 0  Trouble relaxing 0 0 0 0  Restless 0 0 0 0  Easily annoyed or irritable 0 0 0 0  Afraid - awful might happen  0 0 0 0  Total GAD 7 Score 2 0 0 0  Anxiety Difficulty Not difficult at all Not difficult at all         04/19/2022    8:01 AM 10/11/2021    8:30 AM 10/04/2021   10:11 AM  Depression screen PHQ 2/9  Decreased Interest 1 0 0  Down, Depressed, Hopeless 1 0 3  PHQ - 2 Score 2 0 3  Altered sleeping 3 0 0  Tired, decreased energy 0 0 0  Change in appetite 2 0 0  Feeling bad or failure about yourself  0 0 0  Trouble concentrating 0 0 0  Moving slowly or fidgety/restless 0 0 0  Suicidal thoughts 0 0 0  PHQ-9 Score 7 0 3  Difficult doing work/chores Not difficult at all Not difficult at all Not difficult at all    BP Readings from Last 3 Encounters:  04/19/22 (!) 148/100  11/09/21 (!) 142/87  10/11/21 136/88    Physical Exam Vitals and nursing note reviewed.  HENT:     Head: Normocephalic.     Right Ear: Tympanic membrane and external ear normal.     Left Ear: Tympanic membrane and external ear normal.     Nose: Nose normal.     Mouth/Throat:     Mouth: Mucous membranes are moist.  Eyes:     General: No scleral icterus.       Right eye: No discharge.        Left eye:  No discharge.     Conjunctiva/sclera: Conjunctivae normal.     Pupils: Pupils are equal, round, and reactive to light.  Neck:     Thyroid: No thyromegaly.     Vascular: No JVD.     Trachea: No tracheal deviation.  Cardiovascular:     Rate and Rhythm: Normal rate and regular rhythm.     Heart sounds: Normal heart sounds. No murmur heard.    No friction rub. No gallop.  Pulmonary:     Effort: No respiratory distress.     Breath sounds: Normal breath sounds. No wheezing, rhonchi or rales.  Abdominal:     General: Bowel sounds are normal.     Palpations: Abdomen is soft. There is no mass.     Tenderness: There is no abdominal tenderness. There is no guarding or rebound.  Musculoskeletal:        General: No tenderness. Normal range of motion.     Cervical back: Normal range of motion and neck supple.  Lymphadenopathy:     Cervical: No cervical adenopathy.  Skin:    General: Skin is warm.     Findings: No rash.  Neurological:     Mental Status: He is alert and oriented to person, place, and time.     Cranial Nerves: No cranial nerve deficit.     Deep Tendon Reflexes: Reflexes are normal and symmetric.     Wt Readings from Last 3 Encounters:  04/19/22 223 lb (101.2 kg)  11/09/21 224 lb (101.6 kg)  10/11/21 229 lb (103.9 kg)    BP (!) 148/100   Pulse 80   Ht 6' (1.829 m)   Wt 223 lb (101.2 kg)   BMI 30.24 kg/m   Assessment and Plan: 1. Essential hypertension Chronic.  Uncontrolled.  Stable.  On recheck patient continues to have elevated blood pressure and we will in addition to continuance of his valsartan 160 once a day we will add amlodipine 2.5 mg once a  day.  We will recheck in 6 weeks. - valsartan (DIOVAN) 160 MG tablet; Take 1 tablet (160 mg total) by mouth daily.  Dispense: 90 tablet; Refill: 1 - amLODipine (NORVASC) 2.5 MG tablet; Take 1 tablet (2.5 mg total) by mouth daily.  Dispense: 60 tablet; Refill: 0  2. Mixed hyperlipidemia Chronic.  Controlled.  Stable.   Continue atorvastatin 40 mg once a day.  Check lipid panel for current level of control. - atorvastatin (LIPITOR) 40 MG tablet; TAKE 1 TABLET(40 MG) BY MOUTH DAILY  Dispense: 90 tablet; Refill: 1 - Lipid Panel With LDL/HDL Ratio  3. Anxiety and depression Chronic.  Controlled.  Stable.  PHQ is 7 GAD score is 3 continue Wellbutrin SR 100 mg twice a day and Paxil 20 mg once a day. - buPROPion ER (WELLBUTRIN SR) 100 MG 12 hr tablet; Take 1 tablet (100 mg total) by mouth 2 (two) times daily.  Dispense: 180 tablet; Refill: 0 - PARoxetine (PAXIL) 20 MG tablet; TAKE 1 TABLET(20 MG) BY MOUTH DAILY  Dispense: 90 tablet; Refill: 0  4. Benign prostatic hyperplasia with urinary frequency Chronic.  Controlled.  Stable.  Asymptomatic.  Continue tamsulosin 0.4 mg once a day we will check PSA. - tamsulosin (FLOMAX) 0.4 MG CAPS capsule; Take 1 capsule (0.4 mg total) by mouth daily.  Dispense: 90 capsule; Refill: 1 - PSA  5. Bruises easily Patient bruising more easily due to a [place] that is very affectionate.  Patient is on aspirin but has never had a cardiac or cerebrovascular event.  We will discontinue the aspirin and we will check a CBC.  6. Prediabetes Chronic.  Controlled.  Stable.  Currently controlled with diet we will check A1c for current level of control. - HgB A1c - Comprehensive Metabolic Panel (CMET)  7. Vasculogenic erectile dysfunction, unspecified vasculogenic erectile dysfunction type Patient desires to resume Viagra at dosing of 100 mg 1/2-1 as needed as needed - sildenafil (VIAGRA) 100 MG tablet; Take 0.5-1 tablets (50-100 mg total) by mouth daily as needed for erectile dysfunction.  Dispense: 5 tablet; Refill: 5     Otilio Miu, MD

## 2022-04-20 LAB — COMPREHENSIVE METABOLIC PANEL
ALT: 27 IU/L (ref 0–44)
AST: 23 IU/L (ref 0–40)
Albumin/Globulin Ratio: 1.9 (ref 1.2–2.2)
Albumin: 4.5 g/dL (ref 3.9–4.9)
Alkaline Phosphatase: 57 IU/L (ref 44–121)
BUN/Creatinine Ratio: 11 (ref 10–24)
BUN: 11 mg/dL (ref 8–27)
Bilirubin Total: 0.6 mg/dL (ref 0.0–1.2)
CO2: 22 mmol/L (ref 20–29)
Calcium: 9.8 mg/dL (ref 8.6–10.2)
Chloride: 99 mmol/L (ref 96–106)
Creatinine, Ser: 0.98 mg/dL (ref 0.76–1.27)
Globulin, Total: 2.4 g/dL (ref 1.5–4.5)
Glucose: 127 mg/dL — ABNORMAL HIGH (ref 70–99)
Potassium: 4.7 mmol/L (ref 3.5–5.2)
Sodium: 137 mmol/L (ref 134–144)
Total Protein: 6.9 g/dL (ref 6.0–8.5)
eGFR: 83 mL/min/{1.73_m2} (ref >59)

## 2022-04-20 LAB — LIPID PANEL WITH LDL/HDL RATIO
Cholesterol, Total: 179 mg/dL (ref 100–199)
HDL: 90 mg/dL (ref >39)
LDL Chol Calc (NIH): 76 mg/dL (ref 0–99)
LDL/HDL Ratio: 0.8 ratio (ref 0.0–3.6)
Triglycerides: 70 mg/dL (ref 0–149)
VLDL Cholesterol Cal: 13 mg/dL (ref 5–40)

## 2022-04-20 LAB — PSA: Prostate Specific Ag, Serum: 0.7 ng/mL (ref 0.0–4.0)

## 2022-04-20 LAB — HEMOGLOBIN A1C
Est. average glucose Bld gHb Est-mCnc: 123 mg/dL
Hgb A1c MFr Bld: 5.9 % — ABNORMAL HIGH (ref 4.8–5.6)

## 2022-05-14 ENCOUNTER — Other Ambulatory Visit: Payer: Self-pay

## 2022-05-14 ENCOUNTER — Other Ambulatory Visit: Payer: Self-pay | Admitting: Family Medicine

## 2022-05-14 DIAGNOSIS — M542 Cervicalgia: Secondary | ICD-10-CM

## 2022-05-14 MED ORDER — MELOXICAM 15 MG PO TABS: 15.0000 mg | ORAL_TABLET | Freq: Every day | ORAL | 2 refills | Status: DC

## 2022-05-14 NOTE — Telephone Encounter (Signed)
Requested medications are due for refill today.  Unsure  Requested medications are on the active medications list.  yes  Last refill. 10/30/2021 #30 0 rf  Future visit scheduled.   yes  Notes to clinic.  Labs are expired.    Requested Prescriptions  Pending Prescriptions Disp Refills   meloxicam (MOBIC) 15 MG tablet [Pharmacy Med Name: MELOXICAM 15MG TABLETS] 90 tablet     Sig: TAKE 1 TABLET(15 MG) BY MOUTH DAILY     Analgesics:  COX2 Inhibitors Failed - 05/14/2022  3:35 AM      Failed - Manual Review: Labs are only required if the patient has taken medication for more than 8 weeks.      Failed - HGB in normal range and within 360 days    Hemoglobin  Date Value Ref Range Status  05/06/2017 14.1 13.0 - 17.7 g/dL Final         Failed - HCT in normal range and within 360 days    Hematocrit  Date Value Ref Range Status  05/06/2017 41.9 37.5 - 51.0 % Final         Passed - Cr in normal range and within 360 days    Creatinine  Date Value Ref Range Status  09/06/2011 0.94 0.60 - 1.30 mg/dL Final   Creatinine, Ser  Date Value Ref Range Status  04/19/2022 0.98 0.76 - 1.27 mg/dL Final         Passed - AST in normal range and within 360 days    AST  Date Value Ref Range Status  04/19/2022 23 0 - 40 IU/L Final         Passed - ALT in normal range and within 360 days    ALT  Date Value Ref Range Status  04/19/2022 27 0 - 44 IU/L Final         Passed - eGFR is 30 or above and within 360 days    EGFR (African American)  Date Value Ref Range Status  09/06/2011 >60 >73m/min Final   GFR calc Af Amer  Date Value Ref Range Status  08/05/2019 105 >59 mL/min/1.73 Final   EGFR (Non-African Amer.)  Date Value Ref Range Status  09/06/2011 >60 >618mmin Final    Comment:    eGFR values <6057min/1.73 m2 may be an indication of chronic kidney disease (CKD). Calculated eGFR, using the MRDR Study equation, is useful in  patients with stable renal function. The eGFR  calculation will not be reliable in acutely ill patients when serum creatinine is changing rapidly. It is not useful in patients on dialysis. The eGFR calculation may not be applicable to patients at the low and high extremes of body sizes, pregnant women, and vegetarians.    GFR calc non Af Amer  Date Value Ref Range Status  08/05/2019 90 >59 mL/min/1.73 Final   eGFR  Date Value Ref Range Status  04/19/2022 83 >59 mL/min/1.73 Final         Passed - Patient is not pregnant      Passed - Valid encounter within last 12 months    Recent Outpatient Visits           3 weeks ago Essential hypertension   [place] [facility]   7 months ago Essential hypertension   [place] [facility]   1 year ago Essential hypertension   [facility] [place], Deanna C, MD   1 year ago Essential hypertension   [place] [facility] [place], Deanna C, MD   2 years ago Mixed hyperlipidemia   [place] [facility] [place], [place], MD       Future Appointments             In 2 weeks Juline Patch, MD [facility] at [facility], [place]   In 5 months Juline Patch, MD [facility] at [facility], [facility]

## 2022-05-31 ENCOUNTER — Ambulatory Visit (INDEPENDENT_AMBULATORY_CARE_PROVIDER_SITE_OTHER): Payer: Medicare Other | Admitting: Family Medicine

## 2022-05-31 ENCOUNTER — Encounter: Payer: Self-pay | Admitting: Family Medicine

## 2022-05-31 VITALS — BP 142/92 | Ht 72.0 in | Wt 225.0 lb

## 2022-05-31 DIAGNOSIS — I10 Essential (primary) hypertension: Secondary | ICD-10-CM

## 2022-05-31 MED ORDER — AMLODIPINE BESYLATE 5 MG PO TABS: 5.0000 mg | ORAL_TABLET | Freq: Every day | ORAL | 1 refills | Status: DC

## 2022-05-31 NOTE — Progress Notes (Signed)
Date:  05/31/2022   Name:  Dennis Navarro   DOB:  Dec 29, 1950   MRN:  [id number]   Chief Complaint: Hypertension (Added Amlodipine 2.10m to Valsartan at last visit. Home readings have been "higher than I like")  Hypertension This is a chronic problem. The current episode started more than 1 year ago. The problem has been gradually improving since onset. The problem is controlled. Pertinent negatives include no anxiety, blurred vision, chest pain, headaches, malaise/fatigue, neck pain, orthopnea, palpitations, peripheral edema, PND, shortness of breath or sweats. There are no associated agents to hypertension. There are no known risk factors for coronary artery disease. Past treatments include angiotensin blockers and calcium channel blockers. There are no compliance problems.  There is no history of angina, kidney disease, CAD/MI, CVA, heart failure, left ventricular hypertrophy, PVD or retinopathy. There is no history of chronic renal disease, a hypertension causing med or renovascular disease.    Lab Results  Component Value Date   NA 137 04/19/2022   K 4.7 04/19/2022   CO2 22 04/19/2022   GLUCOSE 127 (H) 04/19/2022   BUN 11 04/19/2022   CREATININE 0.98 04/19/2022   CALCIUM 9.8 04/19/2022   EGFR 83 04/19/2022   GFRNONAA 90 08/05/2019   Lab Results  Component Value Date   CHOL 179 04/19/2022   HDL 90 04/19/2022   LDLCALC 76 04/19/2022   TRIG 70 04/19/2022   CHOLHDL 2.2 01/08/2018   Lab Results  Component Value Date   TSH 1.920 05/06/2017   Lab Results  Component Value Date   HGBA1C 5.9 (H) 04/19/2022   Lab Results  Component Value Date   WBC 7.2 05/06/2017   HGB 14.1 05/06/2017   HCT 41.9 05/06/2017   MCV 92 05/06/2017   PLT 262 05/06/2017   Lab Results  Component Value Date   ALT 27 04/19/2022   AST 23 04/19/2022   ALKPHOS 57 04/19/2022   BILITOT 0.6 04/19/2022   No results found for: "25OHVITD2", "25OHVITD3", "VD25OH"   Review of Systems   Constitutional:  Negative for chills, fever and malaise/fatigue.  HENT:  Negative for drooling, ear discharge, ear pain and sore throat.   Eyes:  Negative for blurred vision.  Respiratory:  Negative for cough, shortness of breath and wheezing.   Cardiovascular:  Negative for chest pain, palpitations, orthopnea, leg swelling and PND.  Gastrointestinal:  Negative for abdominal pain, blood in stool, constipation, diarrhea and nausea.  Endocrine: Negative for polydipsia.  Genitourinary:  Negative for dysuria, frequency, hematuria and urgency.  Musculoskeletal:  Negative for back pain, myalgias and neck pain.  Skin:  Negative for rash.  Allergic/Immunologic: Negative for environmental allergies.  Neurological:  Negative for dizziness and headaches.  Hematological:  Does not bruise/bleed easily.  Psychiatric/Behavioral:  Negative for suicidal ideas. The patient is not nervous/anxious.     Patient Active Problem List   Diagnosis Date Noted   Special screening for malignant neoplasms, colon    Polyp of descending colon    Nocturia 09/12/2016   Essential hypertension 12/15/2015   Mixed hyperlipidemia 12/15/2015   Anxiety and depression 12/15/2015    No Known Allergies  Past Surgical History:  Procedure Laterality Date   APPENDECTOMY     COLONOSCOPY  2013   cleared for 5 yrs- Dr EVira Agar  COLONOSCOPY WITH PROPOFOL N/A 11/09/2021   Procedure: COLONOSCOPY WITH PROPOFOL;  Surgeon: WLucilla Lame MD;  [place] [facility]  Service: Endoscopy;  Laterality: N/A;  sleep apnea   HAND  SURGERY Bilateral    HERNIA REPAIR     x 3   POLYPECTOMY  11/09/2021   Procedure: POLYPECTOMY;  Surgeon: Lucilla Lame, MD;  Location: [place];  Service: Endoscopy;;   SEPTOPLASTY     SKIN CANCER EXCISION     under tongue   squamous cell cancer removal from chest      Social History   Tobacco Use   Smoking status: Former    Types: Cigarettes    Quit date: 1977    Years since  quitting: 46.8   Smokeless tobacco: Never   Tobacco comments:    quit 40 years (smoked from age 23 to age 34)  78 Use   Vaping Use: Never used  Substance Use Topics   Alcohol use: Yes    Alcohol/week: 12.0 standard drinks of alcohol    Types: 12 Cans of beer per week   Drug use: No     Medication list has been reviewed and updated.  Current Meds  Medication Sig   amLODipine (NORVASC) 2.5 MG tablet Take 1 tablet (2.5 mg total) by mouth daily.   atorvastatin (LIPITOR) 40 MG tablet TAKE 1 TABLET(40 MG) BY MOUTH DAILY   buPROPion ER (WELLBUTRIN SR) 100 MG 12 hr tablet Take 1 tablet (100 mg total) by mouth 2 (two) times daily.   cyclobenzaprine (FLEXERIL) 10 MG tablet Take 1 tablet (10 mg total) by mouth at bedtime.   meloxicam (MOBIC) 15 MG tablet TAKE 1 TABLET(15 MG) BY MOUTH DAILY   meloxicam (MOBIC) 15 MG tablet Take 1 tablet (15 mg total) by mouth daily.   PARoxetine (PAXIL) 20 MG tablet TAKE 1 TABLET(20 MG) BY MOUTH DAILY   sildenafil (VIAGRA) 100 MG tablet Take 0.5-1 tablets (50-100 mg total) by mouth daily as needed for erectile dysfunction.   tamsulosin (FLOMAX) 0.4 MG CAPS capsule Take 1 capsule (0.4 mg total) by mouth daily.   valsartan (DIOVAN) 160 MG tablet Take 1 tablet (160 mg total) by mouth daily.   [DISCONTINUED] aspirin EC 81 MG tablet Take 81 mg by mouth daily.       05/31/2022   10:18 AM 04/19/2022    8:01 AM 10/11/2021    8:30 AM 04/11/2021    8:35 AM  GAD 7 : Generalized Anxiety Score  Nervous, Anxious, on Edge 0 2 0 0  Control/stop worrying 0 0 0 0  Worry too much - different things 0 0 0 0  Trouble relaxing 0 0 0 0  Restless 0 0 0 0  Easily annoyed or irritable 0 0 0 0  Afraid - awful might happen 0 0 0 0  Total GAD 7 Score 0 2 0 0  Anxiety Difficulty Not difficult at all Not difficult at all Not difficult at all        05/31/2022   10:18 AM 04/19/2022    8:01 AM 10/11/2021    8:30 AM  Depression screen PHQ 2/9  Decreased Interest 0 1 0  Down,  Depressed, Hopeless 0 1 0  PHQ - 2 Score 0 2 0  Altered sleeping 0 3 0  Tired, decreased energy 0 0 0  Change in appetite 0 2 0  Feeling bad or failure about yourself  0 0 0  Trouble concentrating 0 0 0  Moving slowly or fidgety/restless 0 0 0  Suicidal thoughts 0 0 0  PHQ-9 Score 0 7 0  Difficult doing work/chores Not difficult at all Not difficult at all Not difficult at all  BP Readings from Last 3 Encounters:  05/31/22 (!) 142/92  04/19/22 (!) 146/100  11/09/21 (!) 142/87    Physical Exam Vitals reviewed.  HENT:     Head: Normocephalic.     Right Ear: External ear normal.     Left Ear: External ear normal.     Nose: Nose normal.  Eyes:     General: No scleral icterus.       Right eye: No discharge.        Left eye: No discharge.     Conjunctiva/sclera: Conjunctivae normal.     Pupils: Pupils are equal, round, and reactive to light.  Neck:     Thyroid: No thyromegaly.     Vascular: No JVD.     Trachea: No tracheal deviation.  Cardiovascular:     Rate and Rhythm: Normal rate and regular rhythm.     Heart sounds: Normal heart sounds. No murmur heard.    No friction rub. No gallop.  Pulmonary:     Effort: No respiratory distress.     Breath sounds: Normal breath sounds. No wheezing or rales.  Abdominal:     General: Bowel sounds are normal.     Palpations: Abdomen is soft. There is no mass.     Tenderness: There is no abdominal tenderness. There is no guarding or rebound.  Musculoskeletal:        General: No tenderness. Normal range of motion.     Cervical back: Normal range of motion and neck supple.  Lymphadenopathy:     Cervical: No cervical adenopathy.  Skin:    General: Skin is warm.     Findings: No rash.  Neurological:     Mental Status: He is alert.     Wt Readings from Last 3 Encounters:  05/31/22 225 lb (102.1 kg)  04/19/22 223 lb (101.2 kg)  11/09/21 224 lb (101.6 kg)    BP (!) 142/92 (BP Location: Left Arm, Cuff Size: Large)   Ht 6'  (1.829 m)   Wt 225 lb (102.1 kg)   BMI 30.52 kg/m   Assessment and Plan:  1. Essential hypertension Chronic.  Controlled.  Stable.  Blood pressure 142/92.  Asymptomatic.  Stable.  Continue amlodipine 5 mg once a day which is increased from 2.5 mg once a day.  We will recheck patient in 4 to 6 months. - amLODipine (NORVASC) 5 MG tablet; Take 1 tablet (5 mg total) by mouth daily.  Dispense: 90 tablet; Refill: 1    Otilio Miu, MD

## 2022-06-20 ENCOUNTER — Other Ambulatory Visit: Payer: Self-pay | Admitting: Family Medicine

## 2022-06-20 DIAGNOSIS — M542 Cervicalgia: Secondary | ICD-10-CM

## 2022-06-20 NOTE — Telephone Encounter (Signed)
Requested medication (s) are due for refill today:   Provider to review  Requested medication (s) are on the active medication list:   Yes  Future visit scheduled:   Yes   Last ordered: 05/14/2022 #30, 0 refills  Returned because labs H & H due.  Only 30 day supply given.      Requested Prescriptions  Pending Prescriptions Disp Refills   meloxicam (MOBIC) 15 MG tablet [Pharmacy Med Name: MELOXICAM 15MG TABLETS] 30 tablet 0    Sig: TAKE 1 TABLET(15 MG) BY MOUTH DAILY     Analgesics:  COX2 Inhibitors Failed - 06/20/2022  3:33 AM      Failed - Manual Review: Labs are only required if the patient has taken medication for more than 8 weeks.      Failed - HGB in normal range and within 360 days    Hemoglobin  Date Value Ref Range Status  05/06/2017 14.1 13.0 - 17.7 g/dL Final         Failed - HCT in normal range and within 360 days    Hematocrit  Date Value Ref Range Status  05/06/2017 41.9 37.5 - 51.0 % Final         Passed - Cr in normal range and within 360 days    Creatinine  Date Value Ref Range Status  09/06/2011 0.94 0.60 - 1.30 mg/dL Final   Creatinine, Ser  Date Value Ref Range Status  04/19/2022 0.98 0.76 - 1.27 mg/dL Final         Passed - AST in normal range and within 360 days    AST  Date Value Ref Range Status  04/19/2022 23 0 - 40 IU/L Final         Passed - ALT in normal range and within 360 days    ALT  Date Value Ref Range Status  04/19/2022 27 0 - 44 IU/L Final         Passed - eGFR is 30 or above and within 360 days    EGFR (African American)  Date Value Ref Range Status  09/06/2011 >60 >43m/min Final   GFR calc Af Amer  Date Value Ref Range Status  08/05/2019 105 >59 mL/min/1.73 Final   EGFR (Non-African Amer.)  Date Value Ref Range Status  09/06/2011 >60 >654mmin Final    Comment:    eGFR values <6045min/1.73 m2 may be an indication of chronic kidney disease (CKD). Calculated eGFR, using the MRDR Study equation, is useful in   patients with stable renal function. The eGFR calculation will not be reliable in acutely ill patients when serum creatinine is changing rapidly. It is not useful in patients on dialysis. The eGFR calculation may not be applicable to patients at the low and high extremes of body sizes, pregnant women, and vegetarians.    GFR calc non Af Amer  Date Value Ref Range Status  08/05/2019 90 >59 mL/min/1.73 Final   eGFR  Date Value Ref Range Status  04/19/2022 83 >59 mL/min/1.73 Final         Passed - Patient is not pregnant      Passed - Valid encounter within last 12 months    Recent Outpatient Visits           2 weeks ago Essential hypertension   [place] [facility]   2 months ago Essential hypertension   [place] [facility]  I[facility] Finn, MD   8 months ago Essential hypertension   [place] [facility] at [place], Deanna C, MD   1 year ago Essential hypertension   [place] [facility] at [place], Deanna C, MD   1 year ago Essential hypertension   [place] [facility] at [place], [place], MD       Future Appointments             In 4 months Juline Patch, MD [facility] at [facility], [facility]

## 2022-07-19 ENCOUNTER — Other Ambulatory Visit: Payer: Self-pay

## 2022-07-19 DIAGNOSIS — F419 Anxiety disorder, unspecified: Secondary | ICD-10-CM

## 2022-07-19 MED ORDER — PAROXETINE HCL 20 MG PO TABS: ORAL_TABLET | ORAL | 0 refills | Status: DC

## 2022-07-24 DIAGNOSIS — D0462 Carcinoma in situ of skin of left upper limb, including shoulder: Secondary | ICD-10-CM | POA: Diagnosis not present

## 2022-07-24 DIAGNOSIS — L821 Other seborrheic keratosis: Secondary | ICD-10-CM | POA: Diagnosis not present

## 2022-07-24 DIAGNOSIS — Z872 Personal history of diseases of the skin and subcutaneous tissue: Secondary | ICD-10-CM | POA: Diagnosis not present

## 2022-07-24 DIAGNOSIS — L578 Other skin changes due to chronic exposure to nonionizing radiation: Secondary | ICD-10-CM | POA: Diagnosis not present

## 2022-07-24 DIAGNOSIS — Z86018 Personal history of other benign neoplasm: Secondary | ICD-10-CM | POA: Diagnosis not present

## 2022-07-24 DIAGNOSIS — L570 Actinic keratosis: Secondary | ICD-10-CM | POA: Diagnosis not present

## 2022-07-24 DIAGNOSIS — Z859 Personal history of malignant neoplasm, unspecified: Secondary | ICD-10-CM | POA: Diagnosis not present

## 2022-09-11 ENCOUNTER — Other Ambulatory Visit: Payer: Self-pay

## 2022-09-11 ENCOUNTER — Telehealth: Payer: Self-pay | Admitting: Family Medicine

## 2022-09-11 DIAGNOSIS — M542 Cervicalgia: Secondary | ICD-10-CM

## 2022-09-11 MED ORDER — MELOXICAM 15 MG PO TABS: ORAL_TABLET | ORAL | 0 refills | Status: DC

## 2022-09-11 NOTE — Telephone Encounter (Signed)
Copied from [place] [id number]. Topic: Medicare AWV >> Sep 11, 2022 10:23 AM Devoria Glassing wrote: Reason for CRM: LVM on spouses number that AWV has been changed to 10/11/22 '@9'$ :45am. Please confirm appt change date khc

## 2022-09-19 DIAGNOSIS — C44529 Squamous cell carcinoma of skin of other part of trunk: Secondary | ICD-10-CM | POA: Diagnosis not present

## 2022-09-19 DIAGNOSIS — L905 Scar conditions and fibrosis of skin: Secondary | ICD-10-CM | POA: Diagnosis not present

## 2022-10-08 ENCOUNTER — Ambulatory Visit: Payer: Medicare Other

## 2022-10-10 DIAGNOSIS — L570 Actinic keratosis: Secondary | ICD-10-CM | POA: Diagnosis not present

## 2022-10-11 ENCOUNTER — Ambulatory Visit (INDEPENDENT_AMBULATORY_CARE_PROVIDER_SITE_OTHER): Payer: Medicare Other

## 2022-10-11 VITALS — Ht 72.0 in | Wt 225.0 lb

## 2022-10-11 DIAGNOSIS — Z0000 Encounter for general adult medical examination without abnormal findings: Secondary | ICD-10-CM

## 2022-10-11 NOTE — Patient Instructions (Signed)
Dennis Navarro , Thank you for taking time to come for your Medicare Wellness Visit. I appreciate your ongoing commitment to your health goals. Please review the following plan we discussed and let me know if I can assist you in the future.   These are the goals we discussed:  Goals      DIET - EAT MORE FRUITS AND VEGETABLES     Patient Stated     Patient states he would like to maintain his current health and activity level         This is a list of the screening recommended for you and due dates:  Health Maintenance  Topic Date Due   Hepatitis C Screening: USPSTF Recommendation to screen - Ages 18-79 yo.  10/12/2022*   Medicare Annual Wellness Visit  10/11/2023   DTaP/Tdap/Td vaccine (2 - Td or Tdap) 01/21/2026   Colon Cancer Screening  11/10/2026   Pneumonia Vaccine  Completed   Flu Shot  Completed   COVID-19 Vaccine  Completed   Zoster (Shingles) Vaccine  Completed   HPV Vaccine  Aged Out  *Topic was postponed. The date shown is not the original due date.    Advanced directives: no  Conditions/risks identified: none  Next appointment: Follow up in one year for your annual wellness visit. 10/16/22 @ 2:30 pm by phone  Preventive Care 65 Years and Older, Male  Preventive care refers to lifestyle choices and visits with your health care provider that can promote health and wellness. What does preventive care include? A yearly physical exam. This is also called an annual well check. Dental exams once or twice a year. Routine eye exams. Ask your health care provider how often you should have your eyes checked. Personal lifestyle choices, including: Daily care of your teeth and gums. Regular physical activity. Eating a healthy diet. Avoiding tobacco and drug use. Limiting alcohol use. Practicing safe sex. Taking low doses of aspirin every day. Taking vitamin and mineral supplements as recommended by your health care provider. What happens during an annual well check? The  services and screenings done by your health care provider during your annual well check will depend on your age, overall health, lifestyle risk factors, and family history of disease. Counseling  Your health care provider may ask you questions about your: Alcohol use. Tobacco use. Drug use. Emotional well-being. Home and relationship well-being. Sexual activity. Eating habits. History of falls. Memory and ability to understand (cognition). Work and work Statistician. Screening  You may have the following tests or measurements: Height, weight, and BMI. Blood pressure. Lipid and cholesterol levels. These may be checked every 5 years, or more frequently if you are over 54 years old. Skin check. Lung cancer screening. You may have this screening every year starting at age 40 if you have a 30-pack-year history of smoking and currently smoke or have quit within the past 15 years. Fecal occult blood test (FOBT) of the stool. You may have this test every year starting at age 79. Flexible sigmoidoscopy or colonoscopy. You may have a sigmoidoscopy every 5 years or a colonoscopy every 10 years starting at age 59. Prostate cancer screening. Recommendations will vary depending on your family history and other risks. Hepatitis C blood test. Hepatitis B blood test. Sexually transmitted disease (STD) testing. Diabetes screening. This is done by checking your blood sugar (glucose) after you have not eaten for a while (fasting). You may have this done every 1-3 years. Abdominal aortic aneurysm (AAA) screening. You may  need this if you are a current or former smoker. Osteoporosis. You may be screened starting at age 40 if you are at high risk. Talk with your health care provider about your test results, treatment options, and if necessary, the need for more tests. Vaccines  Your health care provider may recommend certain vaccines, such as: Influenza vaccine. This is recommended every year. Tetanus,  diphtheria, and acellular pertussis (Tdap, Td) vaccine. You may need a Td booster every 10 years. Zoster vaccine. You may need this after age 29. Pneumococcal 13-valent conjugate (PCV13) vaccine. One dose is recommended after age 62. Pneumococcal polysaccharide (PPSV23) vaccine. One dose is recommended after age 71. Talk to your health care provider about which screenings and vaccines you need and how often you need them. This information is not intended to replace advice given to you by your health care provider. Make sure you discuss any questions you have with your health care provider. Document Released: 08/26/2015 Document Revised: 04/18/2016 Document Reviewed: 05/31/2015 Elsevier Interactive Patient Education  2017 [employer] Prevention in the Home Falls can cause injuries. They can happen to people of all ages. There are many things you can do to make your home safe and to help prevent falls. What can I do on the outside of my home? Regularly fix the edges of walkways and driveways and fix any cracks. Remove anything that might make you trip as you walk through a door, such as a raised step or threshold. Trim any bushes or trees on the path to your home. Use bright outdoor lighting. Clear any walking paths of anything that might make someone trip, such as rocks or tools. Regularly check to see if handrails are loose or broken. Make sure that both sides of any steps have handrails. Any raised decks and porches should have guardrails on the edges. Have any leaves, snow, or ice cleared regularly. Use sand or salt on walking paths during winter. Clean up any spills in your garage right away. This includes oil or grease spills. What can I do in the bathroom? Use night lights. Install grab bars by the toilet and in the tub and shower. Do not use towel bars as grab bars. Use non-skid mats or decals in the tub or shower. If you need to sit down in the shower, use a plastic,  non-slip stool. Keep the floor dry. Clean up any water that spills on the floor as soon as it happens. Remove soap buildup in the tub or shower regularly. Attach bath mats securely with double-sided non-slip rug tape. Do not have throw rugs and other things on the floor that can make you trip. What can I do in the bedroom? Use night lights. Make sure that you have a light by your bed that is easy to reach. Do not use any sheets or blankets that are too big for your bed. They should not hang down onto the floor. Have a firm chair that has side arms. You can use this for support while you get dressed. Do not have throw rugs and other things on the floor that can make you trip. What can I do in the kitchen? Clean up any spills right away. Avoid walking on wet floors. Keep items that you use a lot in easy-to-reach places. If you need to reach something above you, use a strong step stool that has a grab bar. Keep electrical cords out of the way. Do not use floor polish or wax that makes  floors slippery. If you must use wax, use non-skid floor wax. Do not have throw rugs and other things on the floor that can make you trip. What can I do with my stairs? Do not leave any items on the stairs. Make sure that there are handrails on both sides of the stairs and use them. Fix handrails that are broken or loose. Make sure that handrails are as long as the stairways. Check any carpeting to make sure that it is firmly attached to the stairs. Fix any carpet that is loose or worn. Avoid having throw rugs at the top or bottom of the stairs. If you do have throw rugs, attach them to the floor with carpet tape. Make sure that you have a light switch at the top of the stairs and the bottom of the stairs. If you do not have them, ask someone to add them for you. What else can I do to help prevent falls? Wear shoes that: Do not have high heels. Have rubber bottoms. Are comfortable and fit you well. Are closed  at the toe. Do not wear sandals. If you use a stepladder: Make sure that it is fully opened. Do not climb a closed stepladder. Make sure that both sides of the stepladder are locked into place. Ask someone to hold it for you, if possible. Clearly mark and make sure that you can see: Any grab bars or handrails. First and last steps. Where the edge of each step is. Use tools that help you move around (mobility aids) if they are needed. These include: Canes. Walkers. Scooters. Crutches. Turn on the lights when you go into a dark area. Replace any light bulbs as soon as they burn out. Set up your furniture so you have a clear path. Avoid moving your furniture around. If any of your floors are uneven, fix them. If there are any pets around you, be aware of where they are. Review your medicines with your doctor. Some medicines can make you feel dizzy. This can increase your chance of falling. Ask your doctor what other things that you can do to help prevent falls. This information is not intended to replace advice given to you by your health care provider. Make sure you discuss any questions you have with your health care provider. Document Released: 05/26/2009 Document Revised: 01/05/2016 Document Reviewed: 09/03/2014 Elsevier Interactive Patient Education  2017 [employer].

## 2022-10-11 NOTE — Progress Notes (Signed)
I connected with  Dennis Navarro on 10/11/22 by a audio enabled telemedicine application and verified that I am speaking with the correct person using two identifiers.  Patient Location: Home  Provider Location: Office/Clinic  I discussed the limitations of evaluation and management by telemedicine. The patient expressed understanding and agreed to proceed.  Subjective:   Dennis Navarro is a 72 y.o. male who presents for Medicare Annual/Subsequent preventive examination.  Review of Systems     Cardiac Risk Factors include: advanced age (>52mn, >>46women);dyslipidemia;hypertension;male gender     Objective:    There were no vitals filed for this visit. There is no height or weight on file to calculate BMI.     10/11/2022    9:50 AM 11/09/2021    7:01 AM 10/04/2021   10:13 AM  Advanced Directives  Does Patient Have a Medical Advance Directive? No  No  Would patient like information on creating a medical advance directive? No - Patient declined No - Patient declined No - Patient declined    Current Medications (verified) Outpatient Encounter Medications as of 10/11/2022  Medication Sig   amLODipine (NORVASC) 5 MG tablet Take 1 tablet (5 mg total) by mouth daily.   atorvastatin (LIPITOR) 40 MG tablet TAKE 1 TABLET(40 MG) BY MOUTH DAILY   buPROPion ER (WELLBUTRIN SR) 100 MG 12 hr tablet Take 1 tablet (100 mg total) by mouth 2 (two) times daily.   cyclobenzaprine (FLEXERIL) 10 MG tablet Take 1 tablet (10 mg total) by mouth at bedtime.   meloxicam (MOBIC) 15 MG tablet Take 1 tablet (15 mg total) by mouth daily.   PARoxetine (PAXIL) 20 MG tablet TAKE 1 TABLET(20 MG) BY MOUTH DAILY   sildenafil (VIAGRA) 100 MG tablet Take 0.5-1 tablets (50-100 mg total) by mouth daily as needed for erectile dysfunction.   tamsulosin (FLOMAX) 0.4 MG CAPS capsule Take 1 capsule (0.4 mg total) by mouth daily.   valsartan (DIOVAN) 160 MG tablet Take 1 tablet (160 mg total) by mouth daily.    [DISCONTINUED] meloxicam (MOBIC) 15 MG tablet TAKE 1 TABLET(15 MG) BY MOUTH DAILY   No facility-administered encounter medications on file as of 10/11/2022.    Allergies (verified) Patient has no known allergies.   History: Past Medical History:  Diagnosis Date   Anxiety    BPH (benign prostatic hyperplasia)    Depression    Hyperlipidemia    Hypertension    Skin cancer    Sleep apnea    CPAP   Wears dentures    partial upper   Past Surgical History:  Procedure Laterality Date   APPENDECTOMY     COLONOSCOPY  2013   cleared for 5 yrs- Dr EVira Agar  COLONOSCOPY WITH PROPOFOL N/A 11/09/2021   Procedure: COLONOSCOPY WITH PROPOFOL;  Surgeon: WLucilla Lame MD;  [facility]  Service: Endoscopy;  Laterality: N/A;  sleep apnea   HAND SURGERY Bilateral    HERNIA REPAIR     x 3   POLYPECTOMY  11/09/2021   Procedure: POLYPECTOMY;  Surgeon: WLucilla Lame MD;  [facility]  Service: Endoscopy;;   SEPTOPLASTY     SKIN CANCER EXCISION     under tongue   squamous cell cancer removal from chest     Family History  Problem Relation Age of Onset   Prostate cancer Neg Hx    Kidney cancer Neg Hx    Bladder Cancer Neg Hx    Social History   Socioeconomic History  Marital status: Married    Spouse name: Not on file   Number of children: Not on file   Years of education: Not on file   Highest education level: Not on file  Occupational History   Not on file  Tobacco Use   Smoking status: Former    Types: Cigarettes    Quit date: 47    Years since quitting: 47.1   Smokeless tobacco: Never   Tobacco comments:    quit 40 years (smoked from age 89 to age 79)  7 Use   Vaping Use: Never used  Substance and Sexual Activity   Alcohol use: Yes    Alcohol/week: 12.0 standard drinks of alcohol    Types: 12 Cans of beer per week   Drug use: No   Sexual activity: Yes  Other Topics Concern   Not on file  Social History Narrative   Not on file    Social Determinants of Health   Financial Resource Strain: Low Risk  (10/11/2022)   Overall Financial Resource Strain (CARDIA)    Difficulty of Paying Living Expenses: Not hard at all  Food Insecurity: No Food Insecurity (10/11/2022)   Hunger Vital Sign    Worried About Running Out of Food in the Last Year: Never true    Ran Out of Food in the Last Year: Never true  Transportation Needs: No Transportation Needs (10/11/2022)   PRAPARE - Hydrologist (Medical): No    Lack of Transportation (Non-Medical): No  Physical Activity: Insufficiently Active (10/11/2022)   Exercise Vital Sign    Days of Exercise per Week: 5 days    Minutes of Exercise per Session: 20 min  Stress: No Stress Concern Present (10/11/2022)   [place]    Feeling of Stress : Only a little  Social Connections: Moderately Isolated (10/11/2022)   Social Connection and Isolation Panel [NHANES]    Frequency of Communication with Friends and Family: More than three times a week    Frequency of Social Gatherings with Friends and Family: More than three times a week    Attends Religious Services: Never    Marine scientist or Organizations: No    Attends Music therapist: Never    Marital Status: Married    Tobacco Counseling Counseling given: Not Answered Tobacco comments: quit 40 years (smoked from age 25 to age 70)   Clinical Intake:  Pre-visit preparation completed: Yes  Pain : No/denies pain     Nutritional Risks: None Diabetes: No  How often do you need to have someone help you when you read instructions, pamphlets, or other written materials from your doctor or pharmacy?: 1 - Never  Diabetic?no  Interpreter Needed?: No  Information entered by :: Kirke Shaggy, LPN   Activities of Daily Living    10/11/2022    9:51 AM 11/09/2021    6:52 AM  In your present state of health, do you have  any difficulty performing the following activities:  Hearing? 0 0  Vision? 0 0  Difficulty concentrating or making decisions? 0   Walking or climbing stairs? 0 0  Dressing or bathing? 0 0  Doing errands, shopping? 0   Preparing Food and eating ? N   Using the Toilet? N   In the past six months, have you accidently leaked urine? N   Do you have problems with loss of bowel control? N   Managing your Medications?  N   Managing your Finances? N   Housekeeping or managing your Housekeeping? N     Patient Care Team: Juline Patch, MD as PCP - General (Family Medicine)  Indicate any recent Medical Services you may have received from other than [facility] providers in the past year (date may be approximate).     Assessment:   This is a routine wellness examination for Dennis Navarro.  Hearing/Vision screen Hearing Screening - Comments:: Wears aids Vision Screening - Comments:: Wears glasses- [facility]  Dietary issues and exercise activities discussed: Current Exercise Habits: Home exercise routine, Type of exercise: walking, Time (Minutes): 20, Frequency (Times/Week): 5, Weekly Exercise (Minutes/Week): 100, Intensity: Mild   Goals Addressed             This Visit's Progress    DIET - EAT MORE FRUITS AND VEGETABLES         Depression Screen    10/11/2022    9:51 AM 05/31/2022   10:18 AM 04/19/2022    8:01 AM 10/11/2021    8:30 AM 10/04/2021   10:11 AM 04/11/2021    8:34 AM 10/10/2020    8:15 AM  PHQ 2/9 Scores  PHQ - 2 Score 0 0 2 0 3 0 0  PHQ- 9 Score 0 0 7 0 3 0 0    Fall Risk    10/11/2022    9:50 AM 05/31/2022   10:18 AM 04/19/2022    8:01 AM 10/04/2021   10:14 AM 04/11/2021    8:34 AM  Fall Risk   Falls in the past year? 0  0 0 0  Number falls in past yr: 0 0 0 0 0  Injury with Fall? 0 0 0 0 0  Risk for fall due to : No Fall Risks No Fall Risks No Fall Risks No Fall Risks No Fall Risks  Follow up Falls prevention discussed;Falls evaluation completed Falls evaluation  completed Falls evaluation completed Falls prevention discussed Falls evaluation completed    FALL RISK PREVENTION PERTAINING TO THE HOME:  Any stairs in or around the home? Yes  If so, are there any without handrails? No  Home free of loose throw rugs in walkways, pet beds, electrical cords, etc? Yes  Adequate lighting in your home to reduce risk of falls? Yes   ASSISTIVE DEVICES UTILIZED TO PREVENT FALLS:  Life alert? No  Use of a cane, walker or w/c? No  Grab bars in the bathroom? Yes  Shower chair or bench in shower? No  Elevated toilet seat or a handicapped toilet? Yes    Cognitive Function:        10/11/2022    9:55 AM  6CIT Screen  What Year? 0 points  What month? 0 points  What time? 0 points  Count back from 20 0 points  Months in reverse 0 points  Repeat phrase 6 points  Total Score 6 points    Immunizations Immunization History  Administered Date(s) Administered   COVID-19, mRNA, vaccine(Comirnaty)12 years and older 09/03/2022   Fluad Quad(high Dose 65+) 04/19/2022   Influenza, High Dose Seasonal PF 06/26/2018, 06/30/2020, 07/12/2021   Influenza,inj,Quad PF,6+ Mos 09/12/2016, 04/22/2017   Influenza-Unspecified 06/26/2018, 05/04/2019   PFIZER(Purple Top)SARS-COV-2 Vaccination 09/14/2019, 10/14/2019, 06/10/2020   Pfizer Covid-19 Vaccine Bivalent Booster 38yr & up 07/04/2021   Pneumococcal Conjugate-13 09/12/2016   Pneumococcal Polysaccharide-23 01/08/2018   Tdap 01/22/2016   Zoster Recombinat (Shingrix) 03/02/2019, 05/04/2019    TDAP status: Up to date  Flu Vaccine status: Up to  date  Pneumococcal vaccine status: Up to date  Covid-19 vaccine status: Completed vaccines  Qualifies for Shingles Vaccine? Yes   Zostavax completed No   Shingrix Completed?: Yes  Screening Tests Health Maintenance  Topic Date Due   Hepatitis C Screening  10/12/2022 (Originally 07/17/1969)   Medicare Annual Wellness (AWV)  10/11/2023   DTaP/Tdap/Td (2 - Td or Tdap)  01/21/2026   COLONOSCOPY (Pts 45-40yr Insurance coverage will need to be confirmed)  11/10/2026   Pneumonia Vaccine 72 Years old  Completed   INFLUENZA VACCINE  Completed   COVID-19 Vaccine  Completed   Zoster Vaccines- Shingrix  Completed   HPV VACCINES  Aged Out    Health Maintenance  There are no preventive care reminders to display for this patient.   Colorectal cancer screening: Type of screening: Colonoscopy. Completed 11/09/21. Repeat every 5 years  Lung Cancer Screening: (Low Dose CT Chest recommended if Age 72-80years, 30 pack-year currently smoking OR have quit w/in 15years.) does not qualify.   Additional Screening:  Hepatitis C Screening: does qualify; Completed no  Vision Screening: Recommended annual ophthalmology exams for early detection of glaucoma and other disorders of the eye. Is the patient up to date with their annual eye exam?  Yes  Who is the provider or what is the name of the office in which the patient attends annual eye exams[place] [facility] is not established with a provider, would they like to be referred to a provider to establish care? No .   Dental Screening: Recommended annual dental exams for proper oral hygiene  Community Resource Referral / Chronic Care Management: CRR required this visit?  No   CCM required this visit?  No      Plan:     I have personally reviewed and noted the following in the patient's chart:   Medical and social history Use of alcohol, tobacco or illicit drugs  Current medications and supplements including opioid prescriptions. Patient is not currently taking opioid prescriptions. Functional ability and status Nutritional status Physical activity Advanced directives List of other physicians Hospitalizations, surgeries, and ER visits in previous 12 months Vitals Screenings to include cognitive, depression, and falls Referrals and appointments  In addition, I have reviewed and discussed with patient  certain preventive protocols, quality metrics, and best practice recommendations. A written personalized care plan for preventive services as well as general preventive health recommendations were provided to patient.     LDionisio David LPN   [id number]  Nurse Notes: none

## 2022-10-12 ENCOUNTER — Other Ambulatory Visit: Payer: Self-pay | Admitting: Family Medicine

## 2022-10-12 DIAGNOSIS — N401 Enlarged prostate with lower urinary tract symptoms: Secondary | ICD-10-CM

## 2022-10-15 NOTE — Telephone Encounter (Signed)
Requested Prescriptions  Pending Prescriptions Disp Refills   tamsulosin (FLOMAX) 0.4 MG CAPS capsule [Pharmacy Med Name: TAMSULOSIN 0.'4MG'$  CAPSULES] 90 capsule 0    Sig: TAKE 1 CAPSULE(0.4 MG) BY MOUTH DAILY     Urology: Alpha-Adrenergic Blocker Failed - 10/12/2022  4:01 PM      Failed - Last BP in normal range    BP Readings from Last 1 Encounters:  05/31/22 (!) 142/92         Passed - PSA in normal range and within 360 days    Prostate Specific Ag, Serum  Date Value Ref Range Status  04/19/2022 0.7 0.0 - 4.0 ng/mL Final    Comment:    Roche ECLIA methodology. According to the American Urological Association, Serum PSA should decrease and remain at undetectable levels after radical prostatectomy. The AUA defines biochemical recurrence as an initial PSA value 0.2 ng/mL or greater followed by a subsequent confirmatory PSA value 0.2 ng/mL or greater. Values obtained with different assay methods or kits cannot be used interchangeably. Results cannot be interpreted as absolute evidence of the presence or absence of malignant disease.          Passed - Valid encounter within last 12 months    Recent Outpatient Visits           4 months ago Essential hypertension   [facility] [place], Deanna C, MD   5 months ago Essential hypertension   [facility] [place], Deanna C, MD   1 year ago Essential hypertension   [facility] [place], Deanna C, MD   1 year ago Essential hypertension   [facility] [place], Deanna C, MD   2 years ago Essential hypertension   [place] [facility] [place], [place], MD       Future Appointments             In 3 days Juline Patch, MD [place] [facility], [facility]

## 2022-10-18 ENCOUNTER — Encounter: Payer: Self-pay | Admitting: Family Medicine

## 2022-10-18 ENCOUNTER — Ambulatory Visit (INDEPENDENT_AMBULATORY_CARE_PROVIDER_SITE_OTHER): Payer: Medicare Other | Admitting: Family Medicine

## 2022-10-18 VITALS — BP 124/78 | HR 78 | Ht 72.0 in | Wt 224.0 lb

## 2022-10-18 DIAGNOSIS — E782 Mixed hyperlipidemia: Secondary | ICD-10-CM | POA: Diagnosis not present

## 2022-10-18 DIAGNOSIS — F419 Anxiety disorder, unspecified: Secondary | ICD-10-CM | POA: Diagnosis not present

## 2022-10-18 DIAGNOSIS — R350 Frequency of micturition: Secondary | ICD-10-CM

## 2022-10-18 DIAGNOSIS — I10 Essential (primary) hypertension: Secondary | ICD-10-CM

## 2022-10-18 DIAGNOSIS — R7303 Prediabetes: Secondary | ICD-10-CM

## 2022-10-18 DIAGNOSIS — N401 Enlarged prostate with lower urinary tract symptoms: Secondary | ICD-10-CM

## 2022-10-18 DIAGNOSIS — N529 Male erectile dysfunction, unspecified: Secondary | ICD-10-CM | POA: Diagnosis not present

## 2022-10-18 DIAGNOSIS — F32A Depression, unspecified: Secondary | ICD-10-CM

## 2022-10-18 MED ORDER — BUPROPION HCL ER (SR) 100 MG PO TB12: 100.0000 mg | ORAL_TABLET | Freq: Two times a day (BID) | ORAL | 1 refills | Status: DC

## 2022-10-18 MED ORDER — VALSARTAN 160 MG PO TABS: 160.0000 mg | ORAL_TABLET | Freq: Every day | ORAL | 1 refills | Status: DC

## 2022-10-18 MED ORDER — PAROXETINE HCL 20 MG PO TABS: ORAL_TABLET | ORAL | 1 refills | Status: DC

## 2022-10-18 MED ORDER — ATORVASTATIN CALCIUM 40 MG PO TABS: ORAL_TABLET | ORAL | 1 refills | Status: DC

## 2022-10-18 MED ORDER — SILDENAFIL CITRATE 100 MG PO TABS: 50.0000 mg | ORAL_TABLET | Freq: Every day | ORAL | 5 refills | Status: DC | PRN

## 2022-10-18 MED ORDER — TAMSULOSIN HCL 0.4 MG PO CAPS: ORAL_CAPSULE | ORAL | 1 refills | Status: DC

## 2022-10-18 MED ORDER — AMLODIPINE BESYLATE 5 MG PO TABS: 5.0000 mg | ORAL_TABLET | Freq: Every day | ORAL | 1 refills | Status: DC

## 2022-10-18 NOTE — Progress Notes (Signed)
Date:  10/18/2022   Name:  Dennis Navarro   DOB:  [birth year]   MRN:  [id number]   Chief Complaint: Medication Refill and Toe Injury (Thinks he broke toe, yesterday, is black and pain ful)  Hypertension This is a chronic problem. The current episode started more than 1 month ago. The problem has been gradually improving since onset. The problem is controlled. Pertinent negatives include no chest pain, headaches, palpitations, PND or shortness of breath. There are no associated agents to hypertension. Past treatments include angiotensin blockers and calcium channel blockers. The current treatment provides moderate improvement. There are no compliance problems.  There is no history of CAD/MI or CVA. There is no history of chronic renal disease, a hypertension causing med or renovascular disease.  Depression        This is a chronic problem.  The current episode started more than 1 year ago.   The onset quality is gradual.   The problem has been gradually improving since onset.  Associated symptoms include no decreased concentration, no fatigue, no helplessness, no hopelessness, does not have insomnia, not irritable, no restlessness, no decreased interest, no appetite change, no body aches, no myalgias, no headaches, no indigestion, not sad and no suicidal ideas.     The symptoms are aggravated by nothing.  Compliance with treatment is variable.  Previous treatment provided moderate relief. Benign Prostatic Hypertrophy This is a chronic problem. The current episode started more than 1 year ago. Irritative symptoms do not include frequency, nocturia or urgency. Obstructive symptoms do not include dribbling, incomplete emptying or a weak stream. Pertinent negatives include no chills or dysuria. Nothing aggravates the symptoms. Past treatments include tamsulosin.  Hyperlipidemia This is a chronic problem. The current episode started more than 1 year ago. The problem is controlled. Recent lipid tests were  reviewed and are normal. He has no history of chronic renal disease. Pertinent negatives include no chest pain, myalgias or shortness of breath. Current antihyperlipidemic treatment includes statins. The current treatment provides moderate improvement of lipids.  Erectile Dysfunction This is a chronic problem. The current episode started more than 1 year ago. The problem has been gradually improving since onset. Irritative symptoms do not include frequency, nocturia or urgency. Obstructive symptoms do not include dribbling, incomplete emptying or a weak stream. Pertinent negatives include no chills or dysuria.    Lab Results  Component Value Date   NA 137 04/19/2022   K 4.7 04/19/2022   CO2 22 04/19/2022   GLUCOSE 127 (H) 04/19/2022   BUN 11 04/19/2022   CREATININE 0.98 04/19/2022   CALCIUM 9.8 04/19/2022   EGFR 83 04/19/2022   GFRNONAA 90 08/05/2019   Lab Results  Component Value Date   CHOL 179 04/19/2022   HDL 90 04/19/2022   LDLCALC 76 04/19/2022   TRIG 70 04/19/2022   CHOLHDL 2.2 01/08/2018   Lab Results  Component Value Date   TSH 1.920 05/06/2017   Lab Results  Component Value Date   HGBA1C 5.9 (H) 04/19/2022   Lab Results  Component Value Date   WBC 7.2 05/06/2017   HGB 14.1 05/06/2017   HCT 41.9 05/06/2017   MCV 92 05/06/2017   PLT 262 05/06/2017   Lab Results  Component Value Date   ALT 27 04/19/2022   AST 23 04/19/2022   ALKPHOS 57 04/19/2022   BILITOT 0.6 04/19/2022   No results found for: "25OHVITD2", "25OHVITD3", "VD25OH"   Review of Systems  Constitutional:  Negative for  appetite change, chills and fatigue.  HENT:  Negative for trouble swallowing.   Respiratory:  Negative for shortness of breath and wheezing.   Cardiovascular:  Negative for chest pain, palpitations and PND.  Gastrointestinal:  Negative for abdominal pain.  Endocrine: Negative for polydipsia and polyuria.  Genitourinary:  Negative for dysuria, frequency, incomplete emptying,  nocturia and urgency.  Musculoskeletal:  Negative for myalgias.  Neurological:  Negative for headaches.  Psychiatric/Behavioral:  Positive for depression. Negative for decreased concentration and suicidal ideas. The patient does not have insomnia.     Patient Active Problem List   Diagnosis Date Noted   Special screening for malignant neoplasms, colon    Polyp of descending colon    Nocturia 09/12/2016   Essential hypertension 12/15/2015   Mixed hyperlipidemia 12/15/2015   Anxiety and depression 12/15/2015    No Known Allergies  Past Surgical History:  Procedure Laterality Date   APPENDECTOMY     COLONOSCOPY  2013   cleared for 5 yrs- Dr Vira Agar   COLONOSCOPY WITH PROPOFOL N/A 11/09/2021   Procedure: COLONOSCOPY WITH PROPOFOL;  Surgeon: Lucilla Lame, MD;  Location: [facility];  Service: Endoscopy;  Laterality: N/A;  sleep apnea   HAND SURGERY Bilateral    HERNIA REPAIR     x 3   POLYPECTOMY  11/09/2021   Procedure: POLYPECTOMY;  Surgeon: Lucilla Lame, MD;  Location: [facility];  Service: Endoscopy;;   SEPTOPLASTY     SKIN CANCER EXCISION     under tongue   squamous cell cancer removal from chest      Social History   Tobacco Use   Smoking status: Former    Types: Cigarettes    Quit date: 1977    Years since quitting: 47.2   Smokeless tobacco: Never   Tobacco comments:    quit 40 years (smoked from age 21 to age 65)  59 Use   Vaping Use: Never used  Substance Use Topics   Alcohol use: Yes    Alcohol/week: 12.0 standard drinks of alcohol    Types: 12 Cans of beer per week   Drug use: No     Medication list has been reviewed and updated.  No outpatient medications have been marked as taking for the 10/18/22 encounter (Office Visit) with Juline Patch, MD.       05/31/2022   10:18 AM 04/19/2022    8:01 AM 10/11/2021    8:30 AM 04/11/2021    8:35 AM  GAD 7 : Generalized Anxiety Score  Nervous, Anxious, on Edge 0 2 0 0  Control/stop  worrying 0 0 0 0  Worry too much - different things 0 0 0 0  Trouble relaxing 0 0 0 0  Restless 0 0 0 0  Easily annoyed or irritable 0 0 0 0  Afraid - awful might happen 0 0 0 0  Total GAD 7 Score 0 2 0 0  Anxiety Difficulty Not difficult at all Not difficult at all Not difficult at all        10/11/2022    9:51 AM 05/31/2022   10:18 AM 04/19/2022    8:01 AM  Depression screen PHQ 2/9  Decreased Interest 0 0 1  Down, Depressed, Hopeless 0 0 1  PHQ - 2 Score 0 0 2  Altered sleeping 0 0 3  Tired, decreased energy 0 0 0  Change in appetite 0 0 2  Feeling bad or failure about yourself  0 0 0  Trouble concentrating  0 0 0  Moving slowly or fidgety/restless 0 0 0  Suicidal thoughts 0 0 0  PHQ-9 Score 0 0 7  Difficult doing work/chores Not difficult at all Not difficult at all Not difficult at all    BP Readings from Last 3 Encounters:  10/18/22 134/80  05/31/22 (!) 142/92  04/19/22 (!) 146/100    Physical Exam Vitals and nursing note reviewed.  Constitutional:      General: He is not irritable. HENT:     Head: Normocephalic.     Right Ear: Tympanic membrane and external ear normal.     Left Ear: Tympanic membrane and external ear normal.     Nose: Nose normal. No congestion or rhinorrhea.     Mouth/Throat:     Mouth: Mucous membranes are moist.  Eyes:     General: No scleral icterus.       Right eye: No discharge.        Left eye: No discharge.     Conjunctiva/sclera: Conjunctivae normal.     Pupils: Pupils are equal, round, and reactive to light.  Neck:     Thyroid: No thyromegaly.     Vascular: No JVD.     Trachea: No tracheal deviation.  Cardiovascular:     Rate and Rhythm: Normal rate and regular rhythm.     Heart sounds: Normal heart sounds. No murmur heard.    No friction rub. No gallop.  Pulmonary:     Effort: No respiratory distress.     Breath sounds: Normal breath sounds. No wheezing, rhonchi or rales.  Abdominal:     General: Bowel sounds are normal.      Palpations: Abdomen is soft. There is no mass.     Tenderness: There is no abdominal tenderness. There is no guarding or rebound.  Musculoskeletal:        General: No tenderness. Normal range of motion.     Cervical back: Normal range of motion and neck supple.  Lymphadenopathy:     Cervical: No cervical adenopathy.  Skin:    General: Skin is warm.     Findings: No rash.  Neurological:     Mental Status: He is alert and oriented to person, place, and time.     Cranial Nerves: No cranial nerve deficit.     Deep Tendon Reflexes: Reflexes are normal and symmetric.     Wt Readings from Last 3 Encounters:  10/18/22 224 lb (101.6 kg)  10/11/22 225 lb (102.1 kg)  05/31/22 225 lb (102.1 kg)    BP 134/80   Pulse 78   Ht 6' (1.829 m)   Wt 224 lb (101.6 kg)   SpO2 97%   BMI 30.38 kg/m   Assessment and Plan: 1. Essential hypertension Chronic.  Controlled.  Stable.  Blood pressure today 124/78.  Asymptomatic.  Tolerating medications well.  Continue amlodipine 5 mg once a day and valsartan 160 mg once a day.  Will check renal function panel for GFR and electrolytes.  Will recheck patient in 6 months. - amLODipine (NORVASC) 5 MG tablet; Take 1 tablet (5 mg total) by mouth daily.  Dispense: 90 tablet; Refill: 1 - valsartan (DIOVAN) 160 MG tablet; Take 1 tablet (160 mg total) by mouth daily.  Dispense: 90 tablet; Refill: 1 - Renal Function Panel  2. Mixed hyperlipidemia Chronic.  Controlled.  Stable.  Continue atorvastatin 40 mg once a day. - atorvastatin (LIPITOR) 40 MG tablet; TAKE 1 TABLET(40 MG) BY MOUTH DAILY  Dispense: 90 tablet;  Refill: 1  3. Anxiety and depression Chronic.  Controlled.  Stable.  Continue bupropion 100 mg every 12 hours and Paxil 20 mg once a day.  PHQ is 0 GAD score is 0 will recheck patient in 6 months. - buPROPion ER (WELLBUTRIN SR) 100 MG 12 hr tablet; Take 1 tablet (100 mg total) by mouth 2 (two) times daily.  Dispense: 180 tablet; Refill: 1 - PARoxetine  (PAXIL) 20 MG tablet; TAKE 1 TABLET(20 MG) BY MOUTH DAILY  Dispense: 90 tablet; Refill: 1  4. Vasculogenic erectile dysfunction, unspecified vasculogenic erectile dysfunction type .  Patient is doing well on current dosing of sildenafil 100 mg 1/2 to 1 tablet as needed. - sildenafil (VIAGRA) 100 MG tablet; Take 0.5-1 tablets (50-100 mg total) by mouth daily as needed for erectile dysfunction.  Dispense: 5 tablet; Refill: 5  5. Benign prostatic hyperplasia with urinary frequency Chronic.  Controlled.  Stable.  Continue tamsulosin 0.4 mg once a day. - tamsulosin (FLOMAX) 0.4 MG CAPS capsule; TAKE 1 CAPSULE(0.4 MG) BY MOUTH DAILY  Dispense: 90 capsule; Refill: 1  6. Prediabetes Chronic.  Controlled.  Stable.  Patient is doing diet controlled last A1c was 5.9 and is staying in this range for the last 2 years.  Will recheck A1c for current - HgB A1c     Otilio Miu, MD

## 2022-10-19 LAB — RENAL FUNCTION PANEL
Albumin: 4.6 g/dL (ref 3.8–4.8)
BUN/Creatinine Ratio: 18 (ref 10–24)
BUN: 16 mg/dL (ref 8–27)
CO2: 20 mmol/L (ref 20–29)
Calcium: 9.6 mg/dL (ref 8.6–10.2)
Chloride: 104 mmol/L (ref 96–106)
Creatinine, Ser: 0.88 mg/dL (ref 0.76–1.27)
Glucose: 120 mg/dL — ABNORMAL HIGH (ref 70–99)
Phosphorus: 2.9 mg/dL (ref 2.8–4.1)
Potassium: 4.5 mmol/L (ref 3.5–5.2)
Sodium: 140 mmol/L (ref 134–144)
eGFR: 92 mL/min/{1.73_m2} (ref >59)

## 2022-10-19 LAB — HEMOGLOBIN A1C
Est. average glucose Bld gHb Est-mCnc: 123 mg/dL
Hgb A1c MFr Bld: 5.9 % — ABNORMAL HIGH (ref 4.8–5.6)

## 2022-11-09 ENCOUNTER — Other Ambulatory Visit: Payer: Self-pay

## 2022-11-09 DIAGNOSIS — M542 Cervicalgia: Secondary | ICD-10-CM

## 2022-11-09 MED ORDER — CYCLOBENZAPRINE HCL 10 MG PO TABS: 10.0000 mg | ORAL_TABLET | Freq: Every day | ORAL | 0 refills | Status: DC

## 2022-11-25 ENCOUNTER — Other Ambulatory Visit: Payer: Self-pay | Admitting: Family Medicine

## 2022-11-25 DIAGNOSIS — I10 Essential (primary) hypertension: Secondary | ICD-10-CM

## 2022-11-26 ENCOUNTER — Other Ambulatory Visit: Payer: Self-pay

## 2022-11-26 DIAGNOSIS — I10 Essential (primary) hypertension: Secondary | ICD-10-CM

## 2022-11-26 MED ORDER — AMLODIPINE BESYLATE 5 MG PO TABS: 5.0000 mg | ORAL_TABLET | Freq: Every day | ORAL | 0 refills | Status: DC

## 2022-11-26 NOTE — Telephone Encounter (Signed)
Unable to refill per protocol, Rx request is too soon. Last refill 10/18/22 for 90 and 1 refill.  Requested Prescriptions  Pending Prescriptions Disp Refills   amLODipine (NORVASC) 5 MG tablet [Pharmacy Med Name: AMLODIPINE BESYLATE 5MG  TABLETS] 90 tablet 1    Sig: TAKE 1 TABLET(5 MG) BY MOUTH DAILY     Cardiovascular: Calcium Channel Blockers 2 Passed - 11/25/2022  7:30 AM      Passed - Last BP in normal range    BP Readings from Last 1 Encounters:  10/18/22 124/78         Passed - Last Heart Rate in normal range    Pulse Readings from Last 1 Encounters:  10/18/22 78         Passed - Valid encounter within last 6 months    Recent Outpatient Visits           1 month ago Essential hypertension   [place] [facility]   5 months ago Essential hypertension   [place] [facility]   7 months ago Essential hypertension   [place] [facility]   1 year ago Essential hypertension   [place] [facility]   1 year ago Essential hypertension    [facility]       Future Appointments             In 5 months Duanne Limerick, MD [facility] at [facility], [facility]

## 2022-12-18 ENCOUNTER — Other Ambulatory Visit: Payer: Self-pay

## 2022-12-18 DIAGNOSIS — I10 Essential (primary) hypertension: Secondary | ICD-10-CM

## 2022-12-18 MED ORDER — VALSARTAN 160 MG PO TABS: 160.0000 mg | ORAL_TABLET | Freq: Every day | ORAL | 0 refills | Status: DC

## 2023-01-01 ENCOUNTER — Other Ambulatory Visit: Payer: Self-pay | Admitting: Family Medicine

## 2023-01-01 DIAGNOSIS — M542 Cervicalgia: Secondary | ICD-10-CM

## 2023-01-01 NOTE — Telephone Encounter (Signed)
Requested medication (s) are due for refill today - yes  Requested medication (s) are on the active medication list -yes  Future visit scheduled -yes  Last refill: 3-29/24 #30  Notes to clinic: non delegated Rx  Requested Prescriptions  Pending Prescriptions Disp Refills   cyclobenzaprine (FLEXERIL) 10 MG tablet [Pharmacy Med Name: CYCLOBENZAPRINE 10MG  TABLETS] 30 tablet 0    Sig: TAKE 1 TABLET(10 MG) BY MOUTH AT BEDTIME     Not Delegated - Analgesics:  Muscle Relaxants Failed - 01/01/2023 12:03 PM      Failed - This refill cannot be delegated      Passed - Valid encounter within last 6 months    Recent Outpatient Visits           2 months ago Essential hypertension   [place] [facility]   7 months ago Essential hypertension   [place] [facility]   8 months ago Essential hypertension   [place] [facility]   1 year ago Essential hypertension   [place] [facility]   1 year ago Essential hypertension   [place] [facility]       Future Appointments             In 3 months Duanne Limerick, MD [facility] at [facility], [facility]               Requested Prescriptions  Pending Prescriptions Disp Refills   cyclobenzaprine (FLEXERIL) 10 MG tablet [Pharmacy Med Name: CYCLOBENZAPRINE 10MG  TABLETS] 30 tablet 0    Sig: TAKE 1 TABLET(10 MG) BY MOUTH AT BEDTIME     Not Delegated - Analgesics:  Muscle Relaxants Failed - 01/01/2023 12:03 PM      Failed - This refill cannot be delegated      Passed - Valid encounter within last 6 months    Recent Outpatient Visits           2 months ago Essential hypertension   [place] [facility]   7 months ago Essential hypertension   [place] [facility]   8 months ago Essential hypertension   [place] [facility]   1 year ago Essential hypertension   [place] [facility]   1 year ago Essential hypertension    [facility]       Future Appointments             In 3 months Duanne Limerick, MD [facility] at [facility], [facility]

## 2023-01-10 ENCOUNTER — Other Ambulatory Visit: Payer: Self-pay

## 2023-01-10 DIAGNOSIS — F419 Anxiety disorder, unspecified: Secondary | ICD-10-CM

## 2023-01-10 MED ORDER — PAROXETINE HCL 20 MG PO TABS: ORAL_TABLET | ORAL | 0 refills | Status: DC

## 2023-01-17 ENCOUNTER — Other Ambulatory Visit: Payer: Self-pay | Admitting: Family Medicine

## 2023-01-17 DIAGNOSIS — N401 Enlarged prostate with lower urinary tract symptoms: Secondary | ICD-10-CM

## 2023-01-17 NOTE — Telephone Encounter (Signed)
Unable to refill per protocol, Rx request is too soon. Last refill 10/18/22 for 90 and 1 refill.  Requested Prescriptions  Pending Prescriptions Disp Refills   tamsulosin (FLOMAX) 0.4 MG CAPS capsule [Pharmacy Med Name: TAMSULOSIN 0.4MG  CAPSULES] 90 capsule 1    Sig: TAKE 1 CAPSULE(0.4 MG) BY MOUTH DAILY     Urology: Alpha-Adrenergic Blocker Passed - 01/17/2023 10:14 AM      Passed - PSA in normal range and within 360 days    Prostate Specific Ag, Serum  Date Value Ref Range Status  04/19/2022 0.7 0.0 - 4.0 ng/mL Final    Comment:    Roche ECLIA methodology. According to the American Urological Association, Serum PSA should decrease and remain at undetectable levels after radical prostatectomy. The AUA defines biochemical recurrence as an initial PSA value 0.2 ng/mL or greater followed by a subsequent confirmatory PSA value 0.2 ng/mL or greater. Values obtained with different assay methods or kits cannot be used interchangeably. Results cannot be interpreted as absolute evidence of the presence or absence of malignant disease.          Passed - Last BP in normal range    BP Readings from Last 1 Encounters:  10/18/22 124/78         Passed - Valid encounter within last 12 months    Recent Outpatient Visits           3 months ago Essential hypertension   [place] [facility]   7 months ago Essential hypertension   [place] [facility]   9 months ago Essential hypertension   [place] [facility]   1 year ago Essential hypertension   [place] [facility]   1 year ago Essential hypertension   [place] [facility]       Future Appointments             In 3 months Duanne Limerick, MD [facility] at [facility], [facility]

## 2023-02-20 DIAGNOSIS — Y93K1 Activity, walking an animal: Secondary | ICD-10-CM | POA: Diagnosis not present

## 2023-02-20 DIAGNOSIS — S9002XA Contusion of left ankle, initial encounter: Secondary | ICD-10-CM | POA: Diagnosis not present

## 2023-02-20 DIAGNOSIS — S80211A Abrasion, right knee, initial encounter: Secondary | ICD-10-CM | POA: Diagnosis not present

## 2023-02-20 DIAGNOSIS — R609 Edema, unspecified: Secondary | ICD-10-CM | POA: Diagnosis not present

## 2023-02-20 DIAGNOSIS — S299XXA Unspecified injury of thorax, initial encounter: Secondary | ICD-10-CM | POA: Diagnosis not present

## 2023-02-20 DIAGNOSIS — R200 Anesthesia of skin: Secondary | ICD-10-CM | POA: Diagnosis not present

## 2023-02-20 DIAGNOSIS — S92351D Displaced fracture of fifth metatarsal bone, right foot, subsequent encounter for fracture with routine healing: Secondary | ICD-10-CM | POA: Diagnosis not present

## 2023-02-20 DIAGNOSIS — S80212A Abrasion, left knee, initial encounter: Secondary | ICD-10-CM | POA: Diagnosis not present

## 2023-02-20 DIAGNOSIS — Z87891 Personal history of nicotine dependence: Secondary | ICD-10-CM | POA: Diagnosis not present

## 2023-02-20 DIAGNOSIS — S92351A Displaced fracture of fifth metatarsal bone, right foot, initial encounter for closed fracture: Secondary | ICD-10-CM | POA: Diagnosis not present

## 2023-02-20 DIAGNOSIS — I10 Essential (primary) hypertension: Secondary | ICD-10-CM | POA: Diagnosis not present

## 2023-02-20 DIAGNOSIS — S3993XA Unspecified injury of pelvis, initial encounter: Secondary | ICD-10-CM | POA: Diagnosis not present

## 2023-02-20 DIAGNOSIS — M79605 Pain in left leg: Secondary | ICD-10-CM | POA: Diagnosis not present

## 2023-02-20 DIAGNOSIS — W010XXA Fall on same level from slipping, tripping and stumbling without subsequent striking against object, initial encounter: Secondary | ICD-10-CM | POA: Diagnosis not present

## 2023-02-20 DIAGNOSIS — W19XXXD Unspecified fall, subsequent encounter: Secondary | ICD-10-CM | POA: Diagnosis not present

## 2023-02-20 DIAGNOSIS — M19071 Primary osteoarthritis, right ankle and foot: Secondary | ICD-10-CM | POA: Diagnosis not present

## 2023-02-20 DIAGNOSIS — W19XXXA Unspecified fall, initial encounter: Secondary | ICD-10-CM | POA: Diagnosis not present

## 2023-02-20 DIAGNOSIS — S92354A Nondisplaced fracture of fifth metatarsal bone, right foot, initial encounter for closed fracture: Secondary | ICD-10-CM | POA: Diagnosis not present

## 2023-02-20 DIAGNOSIS — S99912A Unspecified injury of left ankle, initial encounter: Secondary | ICD-10-CM | POA: Diagnosis not present

## 2023-02-20 DIAGNOSIS — S99922A Unspecified injury of left foot, initial encounter: Secondary | ICD-10-CM | POA: Diagnosis not present

## 2023-02-22 ENCOUNTER — Other Ambulatory Visit: Payer: Self-pay | Admitting: Internal Medicine

## 2023-02-22 DIAGNOSIS — M542 Cervicalgia: Secondary | ICD-10-CM

## 2023-03-07 DIAGNOSIS — W010XXA Fall on same level from slipping, tripping and stumbling without subsequent striking against object, initial encounter: Secondary | ICD-10-CM | POA: Diagnosis not present

## 2023-03-07 DIAGNOSIS — S92001D Unspecified fracture of right calcaneus, subsequent encounter for fracture with routine healing: Secondary | ICD-10-CM | POA: Diagnosis not present

## 2023-03-07 DIAGNOSIS — S92354A Nondisplaced fracture of fifth metatarsal bone, right foot, initial encounter for closed fracture: Secondary | ICD-10-CM | POA: Diagnosis not present

## 2023-03-07 DIAGNOSIS — Y93K1 Activity, walking an animal: Secondary | ICD-10-CM | POA: Diagnosis not present

## 2023-03-07 DIAGNOSIS — W010XXD Fall on same level from slipping, tripping and stumbling without subsequent striking against object, subsequent encounter: Secondary | ICD-10-CM | POA: Diagnosis not present

## 2023-03-07 DIAGNOSIS — S92351D Displaced fracture of fifth metatarsal bone, right foot, subsequent encounter for fracture with routine healing: Secondary | ICD-10-CM | POA: Diagnosis not present

## 2023-03-12 DIAGNOSIS — S92354A Nondisplaced fracture of fifth metatarsal bone, right foot, initial encounter for closed fracture: Secondary | ICD-10-CM | POA: Diagnosis not present

## 2023-03-12 DIAGNOSIS — R2689 Other abnormalities of gait and mobility: Secondary | ICD-10-CM | POA: Diagnosis not present

## 2023-03-20 DIAGNOSIS — Z872 Personal history of diseases of the skin and subcutaneous tissue: Secondary | ICD-10-CM | POA: Diagnosis not present

## 2023-03-20 DIAGNOSIS — L578 Other skin changes due to chronic exposure to nonionizing radiation: Secondary | ICD-10-CM | POA: Diagnosis not present

## 2023-03-20 DIAGNOSIS — Z86018 Personal history of other benign neoplasm: Secondary | ICD-10-CM | POA: Diagnosis not present

## 2023-03-20 DIAGNOSIS — Z859 Personal history of malignant neoplasm, unspecified: Secondary | ICD-10-CM | POA: Diagnosis not present

## 2023-04-01 DIAGNOSIS — M7731 Calcaneal spur, right foot: Secondary | ICD-10-CM | POA: Diagnosis not present

## 2023-04-01 DIAGNOSIS — W010XXA Fall on same level from slipping, tripping and stumbling without subsequent striking against object, initial encounter: Secondary | ICD-10-CM | POA: Diagnosis not present

## 2023-04-01 DIAGNOSIS — M19071 Primary osteoarthritis, right ankle and foot: Secondary | ICD-10-CM | POA: Diagnosis not present

## 2023-04-01 DIAGNOSIS — S92354D Nondisplaced fracture of fifth metatarsal bone, right foot, subsequent encounter for fracture with routine healing: Secondary | ICD-10-CM | POA: Diagnosis not present

## 2023-04-01 DIAGNOSIS — S92354A Nondisplaced fracture of fifth metatarsal bone, right foot, initial encounter for closed fracture: Secondary | ICD-10-CM | POA: Diagnosis not present

## 2023-04-01 DIAGNOSIS — Y93K1 Activity, walking an animal: Secondary | ICD-10-CM | POA: Diagnosis not present

## 2023-04-01 DIAGNOSIS — W010XXD Fall on same level from slipping, tripping and stumbling without subsequent striking against object, subsequent encounter: Secondary | ICD-10-CM | POA: Diagnosis not present

## 2023-04-13 ENCOUNTER — Other Ambulatory Visit: Payer: Self-pay | Admitting: Family Medicine

## 2023-04-13 DIAGNOSIS — F419 Anxiety disorder, unspecified: Secondary | ICD-10-CM

## 2023-04-13 DIAGNOSIS — F32A Depression, unspecified: Secondary | ICD-10-CM

## 2023-04-16 ENCOUNTER — Telehealth: Payer: Self-pay | Admitting: Family Medicine

## 2023-04-16 ENCOUNTER — Other Ambulatory Visit: Payer: Self-pay

## 2023-04-16 DIAGNOSIS — F419 Anxiety disorder, unspecified: Secondary | ICD-10-CM

## 2023-04-16 MED ORDER — PAROXETINE HCL 20 MG PO TABS
ORAL_TABLET | ORAL | 0 refills | Status: AC
Start: 2023-04-16 — End: ?

## 2023-04-16 NOTE — Telephone Encounter (Signed)
Medication Refill - Medication: PARoxetine (PAXIL) 20 MG tablet   Pt is requesting a callback from [place]. Stated at his last appointment his medications were not called in. Pt declined to provide medication names stated he needs his medication refills.   Please advise.   Has the patient contacted their pharmacy? Yes.    (Agent: If yes, when and what did the pharmacy advise?)  Preferred Pharmacy (with phone number or street name):  [id number], [place] - [address] AT [facility] OF [address]  [address] [place] [address]  Phone: [phone] Fax: [phone]  Hours: Not open 24 hours   Has the patient been seen for an appointment in the last year OR does the patient have an upcoming appointment? Yes.    Agent: Please be advised that RX refills may take up to 3 business days. We ask that you follow-up with your pharmacy.

## 2023-04-22 DIAGNOSIS — Y93K1 Activity, walking an animal: Secondary | ICD-10-CM | POA: Diagnosis not present

## 2023-04-22 DIAGNOSIS — W010XXA Fall on same level from slipping, tripping and stumbling without subsequent striking against object, initial encounter: Secondary | ICD-10-CM | POA: Diagnosis not present

## 2023-04-22 DIAGNOSIS — W1839XD Other fall on same level, subsequent encounter: Secondary | ICD-10-CM | POA: Diagnosis not present

## 2023-04-22 DIAGNOSIS — M7731 Calcaneal spur, right foot: Secondary | ICD-10-CM | POA: Diagnosis not present

## 2023-04-22 DIAGNOSIS — M19071 Primary osteoarthritis, right ankle and foot: Secondary | ICD-10-CM | POA: Diagnosis not present

## 2023-04-22 DIAGNOSIS — S92354A Nondisplaced fracture of fifth metatarsal bone, right foot, initial encounter for closed fracture: Secondary | ICD-10-CM | POA: Diagnosis not present

## 2023-04-22 DIAGNOSIS — S92354D Nondisplaced fracture of fifth metatarsal bone, right foot, subsequent encounter for fracture with routine healing: Secondary | ICD-10-CM | POA: Diagnosis not present

## 2023-04-29 ENCOUNTER — Telehealth: Payer: Self-pay | Admitting: Family Medicine

## 2023-04-29 ENCOUNTER — Ambulatory Visit: Payer: Medicare Other | Admitting: Family Medicine

## 2023-04-29 NOTE — Telephone Encounter (Signed)
Called Patient to get pt into office this week for med refill. Left Voicemail for Pt to call back and schedule.

## 2023-05-10 ENCOUNTER — Ambulatory Visit (INDEPENDENT_AMBULATORY_CARE_PROVIDER_SITE_OTHER): Payer: Medicare Other | Admitting: Family Medicine

## 2023-05-10 ENCOUNTER — Encounter: Payer: Self-pay | Admitting: Family Medicine

## 2023-05-10 VITALS — BP 129/78 | HR 78 | Ht 72.0 in | Wt 225.0 lb

## 2023-05-10 DIAGNOSIS — I10 Essential (primary) hypertension: Secondary | ICD-10-CM | POA: Diagnosis not present

## 2023-05-10 DIAGNOSIS — F419 Anxiety disorder, unspecified: Secondary | ICD-10-CM | POA: Diagnosis not present

## 2023-05-10 DIAGNOSIS — N529 Male erectile dysfunction, unspecified: Secondary | ICD-10-CM | POA: Diagnosis not present

## 2023-05-10 DIAGNOSIS — E782 Mixed hyperlipidemia: Secondary | ICD-10-CM | POA: Diagnosis not present

## 2023-05-10 DIAGNOSIS — N401 Enlarged prostate with lower urinary tract symptoms: Secondary | ICD-10-CM

## 2023-05-10 DIAGNOSIS — R7303 Prediabetes: Secondary | ICD-10-CM

## 2023-05-10 DIAGNOSIS — F32A Depression, unspecified: Secondary | ICD-10-CM

## 2023-05-10 DIAGNOSIS — Z23 Encounter for immunization: Secondary | ICD-10-CM

## 2023-05-10 DIAGNOSIS — G4733 Obstructive sleep apnea (adult) (pediatric): Secondary | ICD-10-CM

## 2023-05-10 DIAGNOSIS — R350 Frequency of micturition: Secondary | ICD-10-CM

## 2023-05-10 MED ORDER — PAROXETINE HCL 20 MG PO TABS
ORAL_TABLET | ORAL | 1 refills | Status: DC
Start: 2023-05-10 — End: 2023-10-15

## 2023-05-10 MED ORDER — SILDENAFIL CITRATE 100 MG PO TABS
50.0000 mg | ORAL_TABLET | Freq: Every day | ORAL | 5 refills | Status: DC | PRN
Start: 2023-05-10 — End: 2023-10-15

## 2023-05-10 MED ORDER — ATORVASTATIN CALCIUM 40 MG PO TABS
ORAL_TABLET | ORAL | 1 refills | Status: DC
Start: 2023-05-10 — End: 2023-10-15

## 2023-05-10 MED ORDER — AMLODIPINE BESYLATE 5 MG PO TABS
5.0000 mg | ORAL_TABLET | Freq: Every day | ORAL | 1 refills | Status: DC
Start: 2023-05-10 — End: 2023-10-15

## 2023-05-10 MED ORDER — VALSARTAN 160 MG PO TABS
160.0000 mg | ORAL_TABLET | Freq: Every day | ORAL | 1 refills | Status: DC
Start: 2023-05-10 — End: 2023-10-15

## 2023-05-10 MED ORDER — BUPROPION HCL ER (SR) 100 MG PO TB12
100.0000 mg | ORAL_TABLET | Freq: Two times a day (BID) | ORAL | 1 refills | Status: DC
Start: 2023-05-10 — End: 2023-10-15

## 2023-05-10 MED ORDER — TAMSULOSIN HCL 0.4 MG PO CAPS
ORAL_CAPSULE | ORAL | 1 refills | Status: DC
Start: 2023-05-10 — End: 2023-10-15

## 2023-05-10 NOTE — Patient Instructions (Signed)

## 2023-05-10 NOTE — Progress Notes (Signed)
Date:  05/10/2023   Name:  Dennis Navarro   DOB:  08/27/1950   MRN:  [id number]   Chief Complaint: Hypertension, Benign Prostatic Hypertrophy, Erectile Dysfunction, Depression, Hyperlipidemia, flu vacc need, Sleep Apnea (Epworth= 0), and Prediabetes  Hypertension This is a chronic problem. The current episode started more than 1 year ago. The problem has been gradually improving since onset. The problem is controlled. Pertinent negatives include no anxiety, blurred vision, chest pain, headaches, malaise/fatigue, neck pain, orthopnea, palpitations, peripheral edema, PND, shortness of breath or sweats. There are no associated agents to hypertension. Risk factors for coronary artery disease include dyslipidemia and diabetes mellitus. Past treatments include angiotensin blockers and calcium channel blockers. The current treatment provides moderate improvement. There are no compliance problems.  There is no history of angina, kidney disease, CAD/MI, CVA, heart failure, left ventricular hypertrophy, PVD or retinopathy. There is no history of chronic renal disease, a hypertension causing med or renovascular disease.  Benign Prostatic Hypertrophy This is a chronic problem. The current episode started more than 1 year ago. The problem has been gradually improving since onset. Irritative symptoms do not include frequency, nocturia or urgency. Obstructive symptoms do not include dribbling, a slower stream or straining. Pertinent negatives include no chills, dysuria, genital pain, hematuria, hesitancy, nausea or vomiting. Nothing aggravates the symptoms. Past treatments include tamsulosin. The treatment provided moderate relief.  Erectile Dysfunction This is a chronic problem. The current episode started more than 1 year ago. The problem has been gradually improving since onset. The nature of his difficulty is achieving erection. Irritative symptoms do not include frequency, nocturia or urgency. Obstructive  symptoms do not include dribbling, a slower stream or straining. Pertinent negatives include no chills, dysuria, genital pain, hematuria or hesitancy. Past treatments include nothing. The treatment provided moderate relief.  Depression        This is a chronic problem.  The current episode started more than 1 year ago.   The problem has been gradually improving since onset.  Associated symptoms include no decreased concentration, no fatigue, no helplessness, no hopelessness, does not have insomnia, not irritable, no restlessness, no decreased interest, no appetite change, no body aches, no myalgias, no headaches, no indigestion, not sad and no suicidal ideas.     The symptoms are aggravated by nothing.  Past treatments include SSRIs - Selective serotonin reuptake inhibitors.   Pertinent negatives include no hypothyroidism and no anxiety. Hyperlipidemia This is a chronic problem. The current episode started more than 1 year ago. The problem is controlled. Recent lipid tests were reviewed and are normal. He has no history of chronic renal disease, diabetes, hypothyroidism, liver disease, obesity or nephrotic syndrome. Pertinent negatives include no chest pain, focal sensory loss, focal weakness, leg pain, myalgias or shortness of breath. Current antihyperlipidemic treatment includes statins. The current treatment provides moderate improvement of lipids.    Lab Results  Component Value Date   NA 140 10/18/2022   K 4.5 10/18/2022   CO2 20 10/18/2022   GLUCOSE 120 (H) 10/18/2022   BUN 16 10/18/2022   CREATININE 0.88 10/18/2022   CALCIUM 9.6 10/18/2022   EGFR 92 10/18/2022   GFRNONAA 90 08/05/2019   Lab Results  Component Value Date   CHOL 179 04/19/2022   HDL 90 04/19/2022   LDLCALC 76 04/19/2022   TRIG 70 04/19/2022   CHOLHDL 2.2 01/08/2018   Lab Results  Component Value Date   TSH 1.920 05/06/2017   Lab Results  Component Value  Date   HGBA1C 5.9 (H) 10/18/2022   Lab Results   Component Value Date   WBC 7.2 05/06/2017   HGB 14.1 05/06/2017   HCT 41.9 05/06/2017   MCV 92 05/06/2017   PLT 262 05/06/2017   Lab Results  Component Value Date   ALT 27 04/19/2022   AST 23 04/19/2022   ALKPHOS 57 04/19/2022   BILITOT 0.6 04/19/2022   No results found for: "25OHVITD2", "25OHVITD3", "VD25OH"   Review of Systems  Constitutional:  Negative for appetite change, chills, fatigue and malaise/fatigue.  Eyes:  Negative for blurred vision.  Respiratory:  Negative for cough, choking, chest tightness and shortness of breath.   Cardiovascular:  Negative for chest pain, palpitations, orthopnea, leg swelling and PND.  Gastrointestinal:  Negative for nausea and vomiting.  Genitourinary:  Negative for dysuria, frequency, hematuria, hesitancy, nocturia and urgency.  Musculoskeletal:  Negative for myalgias and neck pain.  Neurological:  Negative for dizziness, focal weakness, speech difficulty and headaches.  Hematological:  Does not bruise/bleed easily.  Psychiatric/Behavioral:  Positive for depression. Negative for decreased concentration and suicidal ideas. The patient does not have insomnia.     Patient Active Problem List   Diagnosis Date Noted   Special screening for malignant neoplasms, colon    Polyp of descending colon    Nocturia 09/12/2016   Essential hypertension 12/15/2015   Mixed hyperlipidemia 12/15/2015   Anxiety and depression 12/15/2015    No Known Allergies  Past Surgical History:  Procedure Laterality Date   APPENDECTOMY     COLONOSCOPY  2013   cleared for 5 yrs- Dr Mechele Collin   COLONOSCOPY WITH PROPOFOL N/A 11/09/2021   Procedure: COLONOSCOPY WITH PROPOFOL;  Surgeon: Midge Minium, MD;  Location: [facility];  Service: Endoscopy;  Laterality: N/A;  sleep apnea   HAND SURGERY Bilateral    HERNIA REPAIR     x 3   POLYPECTOMY  11/09/2021   Procedure: POLYPECTOMY;  Surgeon: Midge Minium, MD;  Location: [facility];  Service: Endoscopy;;    SEPTOPLASTY     SKIN CANCER EXCISION     under tongue   squamous cell cancer removal from chest      Social History   Tobacco Use   Smoking status: Former    Current packs/day: 0.00    Types: Cigarettes    Quit date: 1977    Years since quitting: 47.7   Smokeless tobacco: Never   Tobacco comments:    quit 40 years (smoked from age 49 to age 6)  Vaping Use   Vaping status: Never Used  Substance Use Topics   Alcohol use: Yes    Alcohol/week: 12.0 standard drinks of alcohol    Types: 12 Cans of beer per week   Drug use: No     Medication list has been reviewed and updated.  No outpatient medications have been marked as taking for the 05/10/23 encounter (Office Visit) with Duanne Limerick, MD.       05/10/2023    8:50 AM 05/31/2022   10:18 AM 04/19/2022    8:01 AM 10/11/2021    8:30 AM  GAD 7 : Generalized Anxiety Score  Nervous, Anxious, on Edge 0 0 2 0  Control/stop worrying 0 0 0 0  Worry too much - different things 0 0 0 0  Trouble relaxing 0 0 0 0  Restless 0 0 0 0  Easily annoyed or irritable 0 0 0 0  Afraid - awful might happen 0 0 0  0  Total GAD 7 Score 0 0 2 0  Anxiety Difficulty Not difficult at all Not difficult at all Not difficult at all Not difficult at all       05/10/2023    8:50 AM 10/11/2022    9:51 AM 05/31/2022   10:18 AM  Depression screen PHQ 2/9  Decreased Interest 0 0 0  Down, Depressed, Hopeless 0 0 0  PHQ - 2 Score 0 0 0  Altered sleeping 0 0 0  Tired, decreased energy 0 0 0  Change in appetite 0 0 0  Feeling bad or failure about yourself  0 0 0  Trouble concentrating 0 0 0  Moving slowly or fidgety/restless 0 0 0  Suicidal thoughts 0 0 0  PHQ-9 Score 0 0 0  Difficult doing work/chores Not difficult at all Not difficult at all Not difficult at all    BP Readings from Last 3 Encounters:  05/10/23 129/78  10/18/22 124/78  05/31/22 (!) 142/92    Physical Exam Vitals and nursing note reviewed.  Constitutional:      General:  He is not irritable. HENT:     Head: Normocephalic.     Right Ear: Tympanic membrane and external ear normal.     Left Ear: Tympanic membrane and external ear normal.     Nose: Nose normal. No congestion or rhinorrhea.     Mouth/Throat:     Mouth: Mucous membranes are moist.     Pharynx: No oropharyngeal exudate or posterior oropharyngeal erythema.  Eyes:     General: No scleral icterus.       Right eye: No discharge.        Left eye: No discharge.     Conjunctiva/sclera: Conjunctivae normal.     Pupils: Pupils are equal, round, and reactive to light.  Neck:     Thyroid: No thyromegaly.     Vascular: No JVD.     Trachea: No tracheal deviation.  Cardiovascular:     Rate and Rhythm: Normal rate and regular rhythm.     Heart sounds: Normal heart sounds. No murmur heard.    No friction rub. No gallop.  Pulmonary:     Effort: No respiratory distress.     Breath sounds: Normal breath sounds. No wheezing, rhonchi or rales.  Abdominal:     General: Bowel sounds are normal.     Palpations: Abdomen is soft. There is no mass.     Tenderness: There is no abdominal tenderness. There is no guarding or rebound.  Musculoskeletal:        General: No tenderness. Normal range of motion.     Cervical back: Normal range of motion and neck supple.  Lymphadenopathy:     Cervical: No cervical adenopathy.  Skin:    General: Skin is warm.     Findings: No rash.  Neurological:     Mental Status: He is alert and oriented to person, place, and time.     Cranial Nerves: No cranial nerve deficit.     Deep Tendon Reflexes: Reflexes are normal and symmetric.     Wt Readings from Last 3 Encounters:  05/10/23 225 lb (102.1 kg)  10/18/22 224 lb (101.6 kg)  10/11/22 225 lb (102.1 kg)    BP 129/78   Pulse 78   Ht 6' (1.829 m)   Wt 225 lb (102.1 kg)   SpO2 95%   BMI 30.52 kg/m   Assessment and Plan:  1. Essential hypertension Chronic.  Controlled.  Stable.  Blood pressure 129/78.   Asymptomatic.  Tolerating medications well.  Continue amlodipine 5 mg once a day and valsartan 160 mg once a day.  Recheck patient in 6 months. - amLODipine (NORVASC) 5 MG tablet; Take 1 tablet (5 mg total) by mouth daily.  Dispense: 90 tablet; Refill: 1 - valsartan (DIOVAN) 160 MG tablet; Take 1 tablet (160 mg total) by mouth daily.  Dispense: 90 tablet; Refill: 1  2. Mixed hyperlipidemia Chronic.  Controlled.  Stable.  Asymptomatic.  Without myalgias.  Tolerating medications well and will continue atorvastatin 40 mg once a day.  We encourage low-cholesterol low triglyceride dietary guidelines.  Will check lipid panel for current LDL control.  Will recheck patient in 6 months. - atorvastatin (LIPITOR) 40 MG tablet; TAKE 1 TABLET(40 MG) BY MOUTH DAILY  Dispense: 90 tablet; Refill: 1 - Lipid Panel With LDL/HDL Ratio  3. Anxiety and depression Chronic.  Controlled.  Stable.  PHQ is 0.  GAD score 0.  Continue bupropion SR 100 mg twice a day and Paxil 20 mg once a day.  Will recheck patient in 6 months. - buPROPion ER (WELLBUTRIN SR) 100 MG 12 hr tablet; Take 1 tablet (100 mg total) by mouth 2 (two) times daily.  Dispense: 180 tablet; Refill: 1 - PARoxetine (PAXIL) 20 MG tablet; TAKE 1 TABLET(20 MG) BY MOUTH DAILY  Dispense: 90 tablet; Refill: 1  4. Vasculogenic erectile dysfunction, unspecified vasculogenic erectile dysfunction type Chronic.  Controlled.  Stable.  Patient has excellent results on Viagra 100 mg 1/2-1 as needed. - sildenafil (VIAGRA) 100 MG tablet; Take 0.5-1 tablets (50-100 mg total) by mouth daily as needed for erectile dysfunction.  Dispense: 5 tablet; Refill: 5  5. Benign prostatic hyperplasia with urinary frequency Chronic.  Controlled.  Stable.  Asymptomatic.  Has had a DRE in the last year per patient.  Continue tamsulosin 0.4 mg once a day. - tamsulosin (FLOMAX) 0.4 MG CAPS capsule; TAKE 1 CAPSULE(0.4 MG) BY MOUTH DAILY  Dispense: 90 capsule; Refill: 1  6. OSA (obstructive  sleep apnea) Patient has history obstructive sleep apnea.  Patient needs new equipment and we will refer to ear nose and throat for evaluation. - Ambulatory referral to ENT  7. Prediabetes Chronic.  Controlled.  Stable.  Patient is prediabetic and we will check current level of control with A1c. - HgB A1c  8. Need for immunization against influenza Discussed and administered. - Flu Vaccine Trivalent High Dose (Fluad)    Elizabeth Sauer, MD

## 2023-05-11 LAB — LIPID PANEL WITH LDL/HDL RATIO
Cholesterol, Total: 192 mg/dL (ref 100–199)
HDL: 102 mg/dL (ref >39)
LDL Chol Calc (NIH): 75 mg/dL (ref 0–99)
LDL/HDL Ratio: 0.7 {ratio} (ref 0.0–3.6)
Triglycerides: 82 mg/dL (ref 0–149)
VLDL Cholesterol Cal: 15 mg/dL (ref 5–40)

## 2023-05-11 LAB — HEMOGLOBIN A1C
Est. average glucose Bld gHb Est-mCnc: 123 mg/dL
Hgb A1c MFr Bld: 5.9 % — ABNORMAL HIGH (ref 4.8–5.6)

## 2023-05-15 DIAGNOSIS — H903 Sensorineural hearing loss, bilateral: Secondary | ICD-10-CM | POA: Diagnosis not present

## 2023-05-15 DIAGNOSIS — G4733 Obstructive sleep apnea (adult) (pediatric): Secondary | ICD-10-CM | POA: Diagnosis not present

## 2023-05-15 DIAGNOSIS — H6123 Impacted cerumen, bilateral: Secondary | ICD-10-CM | POA: Diagnosis not present

## 2023-06-05 DIAGNOSIS — G4730 Sleep apnea, unspecified: Secondary | ICD-10-CM | POA: Diagnosis not present

## 2023-06-05 DIAGNOSIS — G4733 Obstructive sleep apnea (adult) (pediatric): Secondary | ICD-10-CM | POA: Diagnosis not present

## 2023-07-04 DIAGNOSIS — Z1211 Encounter for screening for malignant neoplasm of colon: Secondary | ICD-10-CM

## 2023-07-17 ENCOUNTER — Ambulatory Visit (INDEPENDENT_AMBULATORY_CARE_PROVIDER_SITE_OTHER): Payer: Medicare Other | Admitting: Family Medicine

## 2023-07-17 ENCOUNTER — Other Ambulatory Visit: Payer: Self-pay | Admitting: Family Medicine

## 2023-07-17 ENCOUNTER — Encounter: Payer: Self-pay | Admitting: Family Medicine

## 2023-07-17 VITALS — BP 130/100 | HR 89 | Ht 72.0 in | Wt 226.2 lb

## 2023-07-17 DIAGNOSIS — M542 Cervicalgia: Secondary | ICD-10-CM | POA: Diagnosis not present

## 2023-07-17 DIAGNOSIS — B029 Zoster without complications: Secondary | ICD-10-CM | POA: Diagnosis not present

## 2023-07-17 DIAGNOSIS — F32A Depression, unspecified: Secondary | ICD-10-CM

## 2023-07-17 MED ORDER — CYCLOBENZAPRINE HCL 10 MG PO TABS
10.0000 mg | ORAL_TABLET | Freq: Every evening | ORAL | 0 refills | Status: DC | PRN
Start: 2023-07-17 — End: 2023-10-15

## 2023-07-17 MED ORDER — GABAPENTIN 300 MG PO CAPS: 300.0000 mg | ORAL_CAPSULE | Freq: Three times a day (TID) | ORAL | 0 refills | Status: DC | PRN

## 2023-07-17 MED ORDER — VALACYCLOVIR HCL 1 G PO TABS
1000.0000 mg | ORAL_TABLET | Freq: Three times a day (TID) | ORAL | 0 refills | Status: DC
Start: 2023-07-17 — End: 2023-07-23

## 2023-07-17 MED ORDER — MELOXICAM 7.5 MG PO TABS: 7.5000 mg | ORAL_TABLET | Freq: Every day | ORAL | 0 refills | Status: DC | PRN

## 2023-07-18 DIAGNOSIS — B029 Zoster without complications: Secondary | ICD-10-CM | POA: Insufficient documentation

## 2023-07-18 DIAGNOSIS — M542 Cervicalgia: Secondary | ICD-10-CM | POA: Insufficient documentation

## 2023-07-18 NOTE — Patient Instructions (Signed)
VISIT SUMMARY:  Mr. Dennis Navarro, you visited today due to a new rash on your upper body, which has been causing you discomfort and pain. You also requested a refill for your muscle relaxant medication.  YOUR PLAN:  -HERPES ZOSTER (SHINGLES): Shingles is a viral infection that causes a painful rash, usually on one side of the body. Although you have had the shingles vaccine, it is still possible to get shingles. We are starting you on Valacyclovir to help speed up your recovery. For pain relief, you can take Gabapentin as needed. You can also take Meloxicam for inflammation, but avoid taking additional Ibuprofen with it. Keep the rash covered, practice good hand hygiene, and avoid close contact with vulnerable individuals to prevent spreading the virus. The rash should heal in 7-10 days, and the pain should resolve in 2-4 weeks.  -MUSCLE RELAXANT REFILL: You requested a refill for your muscle relaxant medication, Flexeril. This has been approved, but please do not take it with Gabapentin as it may cause excessive sedation.  INSTRUCTIONS:  Please start taking Valacyclovir as prescribed. Use Gabapentin and Meloxicam as needed for pain and inflammation, but avoid taking them together with other sedatives or anti-inflammatory medications. Keep the affected area covered and maintain good hygiene. Follow up if symptoms do not improve in the expected timeframe or if you experience any severe side effects.

## 2023-07-18 NOTE — Assessment & Plan Note (Signed)
The patient also reports needing a refill for a muscle relaxer medication taken at night.  Muscle Relaxant Patient reported needing a refill of his muscle relaxant (Flexeril). -Refill Flexeril prescription. Advised not to take with Gabapentin due to potential for excessive sedation.

## 2023-07-18 NOTE — Progress Notes (Signed)
Primary Care / Sports Medicine Office Visit  Patient Information:  Patient ID: GENOVEVO GURUNG, male DOB: Aug 02, 1951 Age: 72 y.o. MRN: [id number]   CARTIER LLANES is a pleasant 72 y.o. male presenting with the following:  Chief Complaint  Patient presents with   Rash    Patient presents today with upper body rash. Patient states the rash has been there since yesterday morning. The rash hurts really bad , he believes it shingles. Its lower left abdomen and goes around to the left side of his back.     Vitals:   07/17/23 1006  BP: (!) 130/100  Pulse: 89  SpO2: 96%   Vitals:   07/17/23 1006  Weight: 226 lb 3.2 oz (102.6 kg)  Height: 6' (1.829 m)   Body mass index is 30.68 kg/m.  No results found.   Independent interpretation of notes and tests performed by another provider:   None  Procedures performed:   None  Pertinent History, Exam, Impression, and Recommendations:   Problem List Items Addressed This Visit       Other   Cervicalgia     The patient also reports needing a refill for a muscle relaxer medication taken at night.  Muscle Relaxant Patient reported needing a refill of his muscle relaxant (Flexeril). -Refill Flexeril prescription. Advised not to take with Gabapentin due to potential for excessive sedation.      Relevant Medications   cyclobenzaprine (FLEXERIL) 10 MG tablet   Herpes zoster without complication - Primary    History of Present Illness Mr. Woollard, a patient with a history of receiving a two-part shingles vaccination, presents with a new onset rash on the upper body. The rash, which is localized to one half of the body (left thorax), has been causing increasing discomfort and pain over the past two days. The patient has not experienced shingles before.    Media Information   Document Information  Photos  Posterolateral left back  07/17/2023 10:24  Attached To:  Office Visit on 07/17/23 with Jerrol Banana, MD   Source Information  Jerrol Banana, MD  [facility]  Document History      Media Information   Document Information  Photos  Anterolateral left abdomen  07/17/2023 10:24  Attached To:  Office Visit on 07/17/23 with Jerrol Banana, MD  Source Information  Jerrol Banana, MD  [facility]  Document History    Herpes Zoster (Shingles) New onset rash on the left posterolateral back and chest, consistent with shingles. Patient reports discomfort and pain. Noted that the patient had completed the shingles vaccination series previously. -Start Valacyclovir. -As needed, Gabapentin for nerve-specific pain relief. Warned about potential side effects. -As needed, Meloxicam for anti-inflammatory effects. Advised to avoid additional Ibuprofen if taking Meloxicam. -Advised to keep the area covered, practice good hand hygiene, and avoid close contact with vulnerable individuals to prevent transmission. -Expect rash healing over the next 7-10 days and pain resolution in 2-4 weeks.      Relevant Medications   valACYclovir (VALTREX) 1000 MG tablet   gabapentin (NEURONTIN) 300 MG capsule   meloxicam (MOBIC) 7.5 MG tablet     Orders & Medications Medications:  Meds ordered this encounter  Medications   valACYclovir (VALTREX) 1000 MG tablet    Sig: Take 1 tablet (1,000 mg total) by mouth 3 (three) times daily for 10 days. Contact us if needing to extend treatment    Dispense:  20 tablet  Refill:  0   gabapentin (NEURONTIN) 300 MG capsule    Sig: Take 1 capsule (300 mg total) by mouth 3 (three) times daily as needed.    Dispense:  60 capsule    Refill:  0   meloxicam (MOBIC) 7.5 MG tablet    Sig: Take 1-2 tablets (7.5-15 mg total) by mouth daily as needed for pain.    Dispense:  14 tablet    Refill:  0   cyclobenzaprine (FLEXERIL) 10 MG tablet    Sig: Take 1 tablet (10 mg total) by mouth at bedtime as needed for muscle spasms.    Dispense:  30 tablet     Refill:  0   No orders of the defined types were placed in this encounter.    No follow-ups on file.     Jerrol Banana, MD, [facility]   Primary Care Sports Medicine Primary Care and Sports Medicine at [facility]

## 2023-07-18 NOTE — Assessment & Plan Note (Addendum)
History of Present Illness Mr. Woollard, a patient with a history of receiving a two-part shingles vaccination, presents with a new onset rash on the upper body. The rash, which is localized to one half of the body (left thorax), has been causing increasing discomfort and pain over the past two days. The patient has not experienced shingles before.    Media Information   Document Information  Photos  Posterolateral left back  07/17/2023 10:24  Attached To:  Office Visit on 07/17/23 with Jerrol Banana, MD  Source Information  Jerrol Banana, MD  [facility]  Document History      Media Information   Document Information  Photos  Anterolateral left abdomen  07/17/2023 10:24  Attached To:  Office Visit on 07/17/23 with Jerrol Banana, MD  Source Information  Jerrol Banana, MD  [facility]  Document History    Herpes Zoster (Shingles) New onset rash on the left posterolateral back and chest, consistent with shingles. Patient reports discomfort and pain. Noted that the patient had completed the shingles vaccination series previously. -Start Valacyclovir. -As needed, Gabapentin for nerve-specific pain relief. Warned about potential side effects. -As needed, Meloxicam for anti-inflammatory effects. Advised to avoid additional Ibuprofen if taking Meloxicam. -Advised to keep the area covered, practice good hand hygiene, and avoid close contact with vulnerable individuals to prevent transmission. -Expect rash healing over the next 7-10 days and pain resolution in 2-4 weeks.

## 2023-07-19 NOTE — Telephone Encounter (Signed)
Reordered 05/10/23 #90 1 RF  Requested Prescriptions  Refused Prescriptions Disp Refills   PARoxetine (PAXIL) 20 MG tablet [Pharmacy Med Name: PAROXETINE 20MG  TABLETS] 90 tablet 1    Sig: TAKE 1 TABLET(20 MG) BY MOUTH DAILY     Psychiatry:  Antidepressants - SSRI Passed - 07/17/2023 10:15 AM      Passed - Completed PHQ-2 or PHQ-9 in the last 360 days      Passed - Valid encounter within last 6 months    Recent Outpatient Visits           2 days ago Herpes zoster without complication   [place] [facility], Ocie Bob, MD   2 months ago Essential hypertension   [place] [facility] Phineas Inches, MD   9 months ago Essential hypertension   [place] [facility] Inches, MD   1 year ago Essential hypertension   [place] [facility] Inches, MD   1 year ago Essential hypertension   [place] [facility] at [facility]ineas Inches, MD       Future Appointments             In 3 months Duanne Limerick, MD [facility] Primary Care & Sports Medicine at [facility], [facility]

## 2023-07-23 ENCOUNTER — Ambulatory Visit: Payer: Self-pay | Admitting: *Deleted

## 2023-07-23 ENCOUNTER — Other Ambulatory Visit: Payer: Self-pay | Admitting: Family Medicine

## 2023-07-23 DIAGNOSIS — B029 Zoster without complications: Secondary | ICD-10-CM

## 2023-07-23 MED ORDER — VALACYCLOVIR HCL 1 G PO TABS
1000.0000 mg | ORAL_TABLET | Freq: Three times a day (TID) | ORAL | 0 refills | Status: AC
Start: 2023-07-23 — End: 2023-07-27

## 2023-07-23 NOTE — Telephone Encounter (Signed)
Please review.  KP

## 2023-07-23 NOTE — Telephone Encounter (Signed)
I returned his call.   He is requesting an extension of the Valtrex 1000 mg for the treatment of Shingles.   He is feeling much, much better.   The pain is only in certain areas and is intermittent now instead of all the time.   He saw Dr. Ashley Royalty for this.  Message sent to Dr. Ashley Royalty with his request for an extension of the Valtrex.     (The Nurse Triage Summary option is not working is reason this is typed out instead of in the usual summary format).    Computer issue.

## 2023-07-23 NOTE — Telephone Encounter (Signed)
Called pt gave him Dr. Ashley Royalty note. Pt verbalized understanding.  KP

## 2023-07-23 NOTE — Telephone Encounter (Signed)
Message from [name] sent at 07/23/2023 10:31 AM EST  Summary: Requesting rx dosage increase   Pt states he is getting better but it still hurts in certain areas. Pt requesting to extend treatment for shingles(valACYclovir (VALTREX) 1000 MG tablet). Has enough for today,  Pt requesting callback          Call History  Contact Date/Time Type Contact Phone/Fax User  07/23/2023 10:29 AM EST Phone (Incoming) Dennis Navarro, Dennis Navarro (Self) [phone] Judie Petit) [name], [name]   Reason for Disposition  [1] Caller has URGENT medicine question about med that PCP or specialist prescribed AND [2] triager unable to answer question    Requesting an extension of the Valtrex 1000 mg tablet for treatment of Shingles.  Answer Assessment - Initial Assessment Questions 1. NAME of MEDICINE: "What medicine(s) are you calling about?"     Valtrex 1000 mg tablets 2. QUESTION: "What is your question?" (e.g., double dose of medicine, side effect)     I was told to call in if I needed an extension of the Valtrex.   I'm doing much, much better.  The pain is only intermittent now instead of all the time.   I still have some areas that still hurt some. 3. PRESCRIBER: "Who prescribed the medicine?" Reason: if prescribed by specialist, call should be referred to that group.     Dr. Joseph Berkshire 4. SYMPTOMS: "Do you have any symptoms?" If Yes, ask: "What symptoms are you having?"  "How bad are the symptoms (e.g., mild, moderate, severe)     Shingles pain.   5. PREGNANCY:  "Is there any chance that you are pregnant?" "When was your last menstrual period?"     N/A  Protocols used: Medication Question Call-A-AH

## 2023-07-25 ENCOUNTER — Other Ambulatory Visit: Payer: Self-pay | Admitting: Family Medicine

## 2023-07-25 DIAGNOSIS — B029 Zoster without complications: Secondary | ICD-10-CM

## 2023-07-25 NOTE — Telephone Encounter (Signed)
Requested medication (s) are due for refill today - unsure  Requested medication (s) are on the active medication list -yes  Future visit scheduled -yes  Last refill: 07/17/23 #14  Notes to clinic: Rx for acute problem- please review for refill- fails lab protocol  Requested Prescriptions  Pending Prescriptions Disp Refills   meloxicam (MOBIC) 7.5 MG tablet [Pharmacy Med Name: MELOXICAM 7.5MG  TABLETS] 14 tablet 0    Sig: TAKE 1 TO 2 TABLETS(7.5 TO 15 MG) BY MOUTH DAILY AS NEEDED FOR PAIN     Analgesics:  COX2 Inhibitors Failed - 07/25/2023 10:31 AM      Failed - Manual Review: Labs are only required if the patient has taken medication for more than 8 weeks.      Failed - HGB in normal range and within 360 days    Hemoglobin  Date Value Ref Range Status  05/06/2017 14.1 13.0 - 17.7 g/dL Final         Failed - HCT in normal range and within 360 days    Hematocrit  Date Value Ref Range Status  05/06/2017 41.9 37.5 - 51.0 % Final         Failed - AST in normal range and within 360 days    AST  Date Value Ref Range Status  04/19/2022 23 0 - 40 IU/L Final         Failed - ALT in normal range and within 360 days    ALT  Date Value Ref Range Status  04/19/2022 27 0 - 44 IU/L Final         Passed - Cr in normal range and within 360 days    Creatinine  Date Value Ref Range Status  09/06/2011 0.94 0.60 - 1.30 mg/dL Final   Creatinine, Ser  Date Value Ref Range Status  10/18/2022 0.88 0.76 - 1.27 mg/dL Final         Passed - eGFR is 30 or above and within 360 days    EGFR (African American)  Date Value Ref Range Status  09/06/2011 >60 >52mL/min Final   GFR calc Af Amer  Date Value Ref Range Status  08/05/2019 105 >59 mL/min/1.73 Final   EGFR (Non-African Amer.)  Date Value Ref Range Status  09/06/2011 >60 >45mL/min Final    Comment:    eGFR values <63mL/min/1.73 m2 may be an indication of chronic kidney disease (CKD). Calculated eGFR, using the MRDR Study  equation, is useful in  patients with stable renal function. The eGFR calculation will not be reliable in acutely ill patients when serum creatinine is changing rapidly. It is not useful in patients on dialysis. The eGFR calculation may not be applicable to patients at the low and high extremes of body sizes, pregnant women, and vegetarians.    GFR calc non Af Amer  Date Value Ref Range Status  08/05/2019 90 >59 mL/min/1.73 Final   eGFR  Date Value Ref Range Status  10/18/2022 92 >59 mL/min/1.73 Final         Passed - Patient is not pregnant      Passed - Valid encounter within last 12 months    Recent Outpatient Visits           1 week ago Herpes zoster without complication   [place] [facility], Ocie Bob, MD   2 months ago Essential hypertension   [facility] at [facility] Inches, MD  9 months ago Essential hypertension   [place] [facility], MD   1 year ago Essential hypertension   [place] [facility], MD   1 year ago Essential hypertension   [place] [facility] at [facility] In[facility], MD       Future Appointments             In 2 months Duanne Limerick, MD [facility] Primary Care & Sports Medicine at [facility], [facility]               Requested Prescriptions  Pending Prescriptions Disp Refills   meloxicam (MOBIC) 7.5 MG tablet [Pharmacy Med Name: MELOXICAM 7.5MG  TABLETS] 14 tablet 0    Sig: TAKE 1 TO 2 TABLETS(7.5 TO 15 MG) BY MOUTH DAILY AS NEEDED FOR PAIN     Analgesics:  COX2 Inhibitors Failed - 07/25/2023 10:31 AM      Failed - Manual Review: Labs are only required if the patient has taken medication for more than 8 weeks.      Failed - HGB in normal range and within 360 days    Hemoglobin  Date  Value Ref Range Status  05/06/2017 14.1 13.0 - 17.7 g/dL Final         Failed - HCT in normal range and within 360 days    Hematocrit  Date Value Ref Range Status  05/06/2017 41.9 37.5 - 51.0 % Final         Failed - AST in normal range and within 360 days    AST  Date Value Ref Range Status  04/19/2022 23 0 - 40 IU/L Final         Failed - ALT in normal range and within 360 days    ALT  Date Value Ref Range Status  04/19/2022 27 0 - 44 IU/L Final         Passed - Cr in normal range and within 360 days    Creatinine  Date Value Ref Range Status  09/06/2011 0.94 0.60 - 1.30 mg/dL Final   Creatinine, Ser  Date Value Ref Range Status  10/18/2022 0.88 0.76 - 1.27 mg/dL Final         Passed - eGFR is 30 or above and within 360 days    EGFR (African American)  Date Value Ref Range Status  09/06/2011 >60 >31mL/min Final   GFR calc Af Amer  Date Value Ref Range Status  08/05/2019 105 >59 mL/min/1.73 Final   EGFR (Non-African Amer.)  Date Value Ref Range Status  09/06/2011 >60 >70mL/min Final    Comment:    eGFR values <56mL/min/1.73 m2 may be an indication of chronic kidney disease (CKD). Calculated eGFR, using the MRDR Study equation, is useful in  patients with stable renal function. The eGFR calculation will not be reliable in acutely ill patients when serum creatinine is changing rapidly. It is not useful in patients on dialysis. The eGFR calculation may not be applicable to patients at the low and high extremes of body sizes, pregnant women, and vegetarians.    GFR calc non Af Amer  Date Value Ref Range Status  08/05/2019 90 >59 mL/min/1.73 Final   eGFR  Date Value Ref Range Status  10/18/2022 92 >59 mL/min/1.73 Final         Passed - Patient is not pregnant  Passed - Valid encounter within last 12 months    Recent Outpatient Visits           1 week ago Herpes zoster without complication   [place] [facility], Ocie Bob, MD   2 months ago Essential hypertension   [place] [facility] Phineas Inches, MD   9 months ago Essential hypertension   [place] [facility] In[facility], MD   1 year ago Essential hypertension   [place] [facility] In[facility], MD   1 year ago Essential hypertension   [place] [facility] at [facility] In[facility], MD       Future Appointments             In 2 months Duanne Limerick, MD [facility] Primary Care & Sports Medicine at [facility], [facility]

## 2023-07-29 DIAGNOSIS — G4733 Obstructive sleep apnea (adult) (pediatric): Secondary | ICD-10-CM | POA: Diagnosis not present

## 2023-08-03 ENCOUNTER — Other Ambulatory Visit: Payer: Self-pay | Admitting: Family Medicine

## 2023-08-03 DIAGNOSIS — B029 Zoster without complications: Secondary | ICD-10-CM

## 2023-08-05 NOTE — Telephone Encounter (Signed)
Requested Prescriptions  Pending Prescriptions Disp Refills   gabapentin (NEURONTIN) 300 MG capsule [Pharmacy Med Name: GABAPENTIN 300MG  CAPSULES] 60 capsule 0    Sig: TAKE 1 CAPSULE(300 MG) BY MOUTH THREE TIMES DAILY AS NEEDED     Neurology: Anticonvulsants - gabapentin Passed - 08/05/2023  1:07 PM      Passed - Cr in normal range and within 360 days    Creatinine  Date Value Ref Range Status  09/06/2011 0.94 0.60 - 1.30 mg/dL Final   Creatinine, Ser  Date Value Ref Range Status  10/18/2022 0.88 0.76 - 1.27 mg/dL Final         Passed - Completed PHQ-2 or PHQ-9 in the last 360 days      Passed - Valid encounter within last 12 months    Recent Outpatient Visits           2 weeks ago Herpes zoster without complication   [place] [facility], Ocie Bob, MD   2 months ago Essential hypertension   [place] [facility] Inches, MD   9 months ago Essential hypertension   [place] [facility], MD   1 year ago Essential hypertension   [place] [facility], MD   1 year ago Essential hypertension   [place] [facility] at [facility], MD       Future Appointments             In 2 months Duanne Limerick, MD [facility] Primary Care & Sports Medicine at [facility], [facility]

## 2023-08-27 ENCOUNTER — Other Ambulatory Visit: Payer: Self-pay | Admitting: Internal Medicine

## 2023-08-27 DIAGNOSIS — B029 Zoster without complications: Secondary | ICD-10-CM

## 2023-08-28 NOTE — Telephone Encounter (Signed)
 Requested Prescriptions  Pending Prescriptions Disp Refills   gabapentin  (NEURONTIN ) 300 MG capsule [Pharmacy Med Name: GABAPENTIN  300MG  CAPSULES] 60 capsule 0    Sig: TAKE 1 CAPSULE(300 MG) BY MOUTH THREE TIMES DAILY AS NEEDED     Neurology: Anticonvulsants - gabapentin  Passed - 08/28/2023  9:02 AM      Passed - Cr in normal range and within 360 days    Creatinine  Date Value Ref Range Status  09/06/2011 0.94 0.60 - 1.30 mg/dL Final   Creatinine, Ser  Date Value Ref Range Status  10/18/2022 0.88 0.76 - 1.27 mg/dL Final         Passed - Completed PHQ-2 or PHQ-9 in the last 360 days      Passed - Valid encounter within last 12 months    Recent Outpatient Visits           1 month ago Herpes zoster without complication   [place] [facility] Augustus Ledger, Dessie Flow, MD   3 months ago Essential hypertension   [place] [facility] Kayla Part, MD   10 months ago Essential hypertension   [place] [facility] Part, MD   1 year ago Essential hypertension   [place] [facility] Part, MD   1 year ago Essential hypertension   [place] [facility] Part, MD       Future Appointments             In 1 month Clarise Crooks, MD [facility] Primary Care & Sports Medicine at [facility], [facility]

## 2023-08-29 DIAGNOSIS — G4733 Obstructive sleep apnea (adult) (pediatric): Secondary | ICD-10-CM | POA: Diagnosis not present

## 2023-09-16 ENCOUNTER — Other Ambulatory Visit: Payer: Self-pay | Admitting: Internal Medicine

## 2023-09-16 DIAGNOSIS — B029 Zoster without complications: Secondary | ICD-10-CM

## 2023-09-17 NOTE — Telephone Encounter (Signed)
 Requested Prescriptions  Pending Prescriptions Disp Refills   gabapentin  (NEURONTIN ) 300 MG capsule [Pharmacy Med Name: GABAPENTIN  300MG  CAPSULES] 60 capsule 0    Sig: TAKE 1 CAPSULE(300 MG) BY MOUTH THREE TIMES DAILY AS NEEDED     Neurology: Anticonvulsants - gabapentin  Passed - 09/17/2023  1:36 PM      Passed - Cr in normal range and within 360 days    Creatinine  Date Value Ref Range Status  09/06/2011 0.94 0.60 - 1.30 mg/dL Final   Creatinine, Ser  Date Value Ref Range Status  10/18/2022 0.88 0.76 - 1.27 mg/dL Final         Passed - Completed PHQ-2 or PHQ-9 in the last 360 days      Passed - Valid encounter within last 12 months    Recent Outpatient Visits           2 months ago Herpes zoster without complication   [place] [facility] Alvia, Selinda PARAS, MD   4 months ago Essential hypertension   [place] [facility] Joshua Cathryne JAYSON, MD   11 months ago Essential hypertension   [place] [facility] Joshua Cathryne JAYSON, MD   1 year ago Essential hypertension   [place] [facility] Joshua Cathryne JAYSON, MD   1 year ago Essential hypertension   [place] [facility] Joshua Cathryne JAYSON, MD       Future Appointments             In 1 month Joshua Cathryne JAYSON, MD [facility] Primary Care & Sports Medicine at [facility], [facility]

## 2023-09-29 DIAGNOSIS — G4733 Obstructive sleep apnea (adult) (pediatric): Secondary | ICD-10-CM | POA: Diagnosis not present

## 2023-10-02 DIAGNOSIS — H903 Sensorineural hearing loss, bilateral: Secondary | ICD-10-CM | POA: Diagnosis not present

## 2023-10-02 DIAGNOSIS — G4733 Obstructive sleep apnea (adult) (pediatric): Secondary | ICD-10-CM | POA: Diagnosis not present

## 2023-10-07 ENCOUNTER — Other Ambulatory Visit: Payer: Self-pay | Admitting: Family Medicine

## 2023-10-07 DIAGNOSIS — B029 Zoster without complications: Secondary | ICD-10-CM

## 2023-10-15 ENCOUNTER — Ambulatory Visit (INDEPENDENT_AMBULATORY_CARE_PROVIDER_SITE_OTHER): Payer: Medicare Other | Admitting: Family Medicine

## 2023-10-15 ENCOUNTER — Encounter: Payer: Self-pay | Admitting: Family Medicine

## 2023-10-15 VITALS — BP 126/78 | HR 91 | Ht 73.0 in | Wt 229.0 lb

## 2023-10-15 DIAGNOSIS — F419 Anxiety disorder, unspecified: Secondary | ICD-10-CM | POA: Diagnosis not present

## 2023-10-15 DIAGNOSIS — N529 Male erectile dysfunction, unspecified: Secondary | ICD-10-CM

## 2023-10-15 DIAGNOSIS — F32A Depression, unspecified: Secondary | ICD-10-CM

## 2023-10-15 DIAGNOSIS — R350 Frequency of micturition: Secondary | ICD-10-CM

## 2023-10-15 DIAGNOSIS — E782 Mixed hyperlipidemia: Secondary | ICD-10-CM | POA: Diagnosis not present

## 2023-10-15 DIAGNOSIS — B029 Zoster without complications: Secondary | ICD-10-CM

## 2023-10-15 DIAGNOSIS — N401 Enlarged prostate with lower urinary tract symptoms: Secondary | ICD-10-CM

## 2023-10-15 DIAGNOSIS — R7303 Prediabetes: Secondary | ICD-10-CM | POA: Diagnosis not present

## 2023-10-15 DIAGNOSIS — M542 Cervicalgia: Secondary | ICD-10-CM

## 2023-10-15 DIAGNOSIS — I10 Essential (primary) hypertension: Secondary | ICD-10-CM | POA: Diagnosis not present

## 2023-10-15 MED ORDER — CYCLOBENZAPRINE HCL 10 MG PO TABS
10.0000 mg | ORAL_TABLET | Freq: Every evening | ORAL | 1 refills | Status: DC | PRN
Start: 2023-10-15 — End: 2024-04-06

## 2023-10-15 MED ORDER — VALSARTAN 160 MG PO TABS: 160.0000 mg | ORAL_TABLET | Freq: Every day | ORAL | 1 refills | Status: DC

## 2023-10-15 MED ORDER — MELOXICAM 7.5 MG PO TABS: 7.5000 mg | ORAL_TABLET | Freq: Every day | ORAL | 1 refills | Status: DC | PRN

## 2023-10-15 MED ORDER — TAMSULOSIN HCL 0.4 MG PO CAPS: ORAL_CAPSULE | ORAL | 1 refills | Status: DC

## 2023-10-15 MED ORDER — AMLODIPINE BESYLATE 5 MG PO TABS: 5.0000 mg | ORAL_TABLET | Freq: Every day | ORAL | 1 refills | Status: DC

## 2023-10-15 MED ORDER — PAROXETINE HCL 20 MG PO TABS: ORAL_TABLET | ORAL | 1 refills | Status: DC

## 2023-10-15 MED ORDER — BUPROPION HCL ER (SR) 100 MG PO TB12: 100.0000 mg | ORAL_TABLET | Freq: Two times a day (BID) | ORAL | 1 refills | Status: DC

## 2023-10-15 MED ORDER — ATORVASTATIN CALCIUM 40 MG PO TABS
ORAL_TABLET | ORAL | 1 refills | Status: DC
Start: 2023-10-15 — End: 2024-04-06

## 2023-10-15 MED ORDER — SILDENAFIL CITRATE 100 MG PO TABS: 50.0000 mg | ORAL_TABLET | Freq: Every day | ORAL | 5 refills | Status: DC | PRN

## 2023-10-15 NOTE — Patient Instructions (Signed)

## 2023-10-15 NOTE — Progress Notes (Signed)
 Date:  10/15/2023   Name:  Dennis Navarro   DOB:  November 28, 1950   MRN:  [id number]   Chief Complaint: Hypertension, Hyperlipidemia, and Depression  Hypertension This is a chronic problem. The current episode started more than 1 year ago. The problem has been gradually improving since onset. The problem is controlled. Associated symptoms include neck pain. Pertinent negatives include no anxiety, blurred vision, chest pain, headaches, malaise/fatigue, orthopnea, palpitations, peripheral edema, PND, shortness of breath or sweats. There are no associated agents to hypertension. Risk factors for coronary artery disease include diabetes mellitus. Past treatments include angiotensin blockers and calcium channel blockers. The current treatment provides moderate improvement. There are no compliance problems.  There is no history of CAD/MI or CVA. There is no history of chronic renal disease, a hypertension causing med or renovascular disease.  Hyperlipidemia This is a chronic problem. The current episode started more than 1 year ago. The problem is controlled. Recent lipid tests were reviewed and are normal. He has no history of chronic renal disease. Pertinent negatives include no chest pain, myalgias or shortness of breath. Current antihyperlipidemic treatment includes statins. The current treatment provides moderate improvement of lipids. There are no compliance problems.  Risk factors for coronary artery disease include dyslipidemia and hypertension.  Depression        This is a chronic problem.  The current episode started more than 1 year ago.   The problem occurs rarely.  The problem has been gradually improving since onset.  Associated symptoms include no decreased concentration, no fatigue, no helplessness, no hopelessness, does not have insomnia, not irritable, no restlessness, no decreased interest, no appetite change, no body aches, no myalgias, no headaches, no indigestion, not sad and no suicidal  ideas.  Past treatments include SSRIs - Selective serotonin reuptake inhibitors.  Compliance with treatment is variable and good.  Previous treatment provided moderate relief.   Pertinent negatives include no anxiety. Neck Pain  This is a chronic problem. The current episode started more than 1 year ago. The problem occurs intermittently. The problem has been gradually improving. The pain is moderate. Pertinent negatives include no chest pain or headaches. He has tried NSAIDs and muscle relaxants for the symptoms. The treatment provided moderate relief.    Lab Results  Component Value Date   NA 140 10/18/2022   K 4.5 10/18/2022   CO2 20 10/18/2022   GLUCOSE 120 (H) 10/18/2022   BUN 16 10/18/2022   CREATININE 0.88 10/18/2022   CALCIUM 9.6 10/18/2022   EGFR 92 10/18/2022   GFRNONAA 90 08/05/2019   Lab Results  Component Value Date   CHOL 192 05/10/2023   HDL 102 05/10/2023   LDLCALC 75 05/10/2023   TRIG 82 05/10/2023   CHOLHDL 2.2 01/08/2018   Lab Results  Component Value Date   TSH 1.920 05/06/2017   Lab Results  Component Value Date   HGBA1C 5.9 (H) 05/10/2023   Lab Results  Component Value Date   WBC 7.2 05/06/2017   HGB 14.1 05/06/2017   HCT 41.9 05/06/2017   MCV 92 05/06/2017   PLT 262 05/06/2017   Lab Results  Component Value Date   ALT 27 04/19/2022   AST 23 04/19/2022   ALKPHOS 57 04/19/2022   BILITOT 0.6 04/19/2022   No results found for: "25OHVITD2", "25OHVITD3", "VD25OH"   Review of Systems  Constitutional:  Negative for appetite change, fatigue and malaise/fatigue.  Eyes:  Negative for blurred vision.  Respiratory:  Negative for apnea,  choking, chest tightness, shortness of breath and wheezing.   Cardiovascular:  Negative for chest pain, palpitations, orthopnea, leg swelling and PND.  Gastrointestinal:  Negative for abdominal distention, anal bleeding and blood in stool.  Endocrine: Negative for polydipsia and polyuria.  Genitourinary:  Negative for  difficulty urinating, hematuria and urgency.  Musculoskeletal:  Positive for neck pain. Negative for myalgias.  Neurological:  Negative for headaches.  Psychiatric/Behavioral:  Positive for depression. Negative for decreased concentration and suicidal ideas. The patient does not have insomnia.     Patient Active Problem List   Diagnosis Date Noted   Herpes zoster without complication 07/18/2023   Cervicalgia 07/18/2023   Special screening for malignant neoplasms, colon    Polyp of descending colon    Nocturia 09/12/2016   Essential hypertension 12/15/2015   Mixed hyperlipidemia 12/15/2015   Anxiety and depression 12/15/2015    No Known Allergies  Past Surgical History:  Procedure Laterality Date   APPENDECTOMY     COLONOSCOPY  2013   cleared for 5 yrs- Dr Mechele Collin   COLONOSCOPY WITH PROPOFOL N/A 11/09/2021   Procedure: COLONOSCOPY WITH PROPOFOL;  Surgeon: Midge Minium, MD;  Location: [facility];  Service: Endoscopy;  Laterality: N/A;  sleep apnea   HAND SURGERY Bilateral    HERNIA REPAIR     x 3   POLYPECTOMY  11/09/2021   Procedure: POLYPECTOMY;  Surgeon: Midge Minium, MD;  Location: [facility];  Service: Endoscopy;;   SEPTOPLASTY     SKIN CANCER EXCISION     under tongue   squamous cell cancer removal from chest      Social History   Tobacco Use   Smoking status: Former    Current packs/day: 0.00    Types: Cigarettes    Quit date: 1977    Years since quitting: 48.2   Smokeless tobacco: Never   Tobacco comments:    quit 40 years (smoked from age 47 to age 89)  Vaping Use   Vaping status: Never Used  Substance Use Topics   Alcohol use: Yes    Alcohol/week: 12.0 standard drinks of alcohol    Types: 12 Cans of beer per week   Drug use: No     Medication list has been reviewed and updated.  Current Meds  Medication Sig   amLODipine (NORVASC) 5 MG tablet Take 1 tablet (5 mg total) by mouth daily.   atorvastatin (LIPITOR) 40 MG tablet TAKE 1  TABLET(40 MG) BY MOUTH DAILY   buPROPion ER (WELLBUTRIN SR) 100 MG 12 hr tablet Take 1 tablet (100 mg total) by mouth 2 (two) times daily.   cyclobenzaprine (FLEXERIL) 10 MG tablet Take 1 tablet (10 mg total) by mouth at bedtime as needed for muscle spasms.   meloxicam (MOBIC) 7.5 MG tablet Take 1-2 tablets (7.5-15 mg total) by mouth daily as needed for pain.   PARoxetine (PAXIL) 20 MG tablet TAKE 1 TABLET(20 MG) BY MOUTH DAILY   tamsulosin (FLOMAX) 0.4 MG CAPS capsule TAKE 1 CAPSULE(0.4 MG) BY MOUTH DAILY   valsartan (DIOVAN) 160 MG tablet Take 1 tablet (160 mg total) by mouth daily.   [DISCONTINUED] gabapentin (NEURONTIN) 300 MG capsule TAKE 1 CAPSULE(300 MG) BY MOUTH THREE TIMES DAILY AS NEEDED   sildenafil (VIAGRA) 100 MG tablet Take 0.5-1 tablets (50-100 mg total) by mouth daily as needed for erectile dysfunction.       10/15/2023    9:22 AM 07/17/2023   10:13 AM 05/10/2023    8:50 AM 05/31/2022  10:18 AM  GAD 7 : Generalized Anxiety Score  Nervous, Anxious, on Edge 0 0 0 0  Control/stop worrying 0 0 0 0  Worry too much - different things 0 0 0 0  Trouble relaxing 0 0 0 0  Restless 0 1 0 0  Easily annoyed or irritable 0 0 0 0  Afraid - awful might happen 0 0 0 0  Total GAD 7 Score 0 1 0 0  Anxiety Difficulty Not difficult at all Not difficult at all Not difficult at all Not difficult at all       10/15/2023    9:22 AM 07/17/2023   10:12 AM 05/10/2023    8:50 AM  Depression screen PHQ 2/9  Decreased Interest 0 0 0  Down, Depressed, Hopeless 0 1 0  PHQ - 2 Score 0 1 0  Altered sleeping 0 0 0  Tired, decreased energy 0 0 0  Change in appetite 0 0 0  Feeling bad or failure about yourself  0 0 0  Trouble concentrating 0 0 0  Moving slowly or fidgety/restless 0 0 0  Suicidal thoughts 0 0 0  PHQ-9 Score 0 1 0  Difficult doing work/chores Not difficult at all Not difficult at all Not difficult at all    BP Readings from Last 3 Encounters:  10/15/23 126/78  07/17/23 (!)  130/100  05/10/23 129/78    Physical Exam Vitals and nursing note reviewed.  Constitutional:      General: He is not irritable.    Appearance: He is well-developed.  HENT:     Head: Normocephalic and atraumatic.     Right Ear: Tympanic membrane, ear canal and external ear normal.     Left Ear: Tympanic membrane, ear canal and external ear normal.     Nose: Nose normal.     Mouth/Throat:     Dentition: Normal dentition.  Eyes:     General: Lids are normal. No scleral icterus.    Conjunctiva/sclera: Conjunctivae normal.     Pupils: Pupils are equal, round, and reactive to light.  Neck:     Thyroid: No thyromegaly.     Vascular: No carotid bruit, hepatojugular reflux or JVD.     Trachea: No tracheal deviation.  Cardiovascular:     Rate and Rhythm: Normal rate and regular rhythm.     Pulses: No decreased pulses.     Heart sounds: Normal heart sounds, S1 normal and S2 normal.     No systolic murmur is present.     No diastolic murmur is present.     No S3 or S4 sounds.  Pulmonary:     Effort: Pulmonary effort is normal.     Breath sounds: Normal breath sounds. No wheezing, rhonchi or rales.  Abdominal:     General: Bowel sounds are normal.     Palpations: Abdomen is soft. There is no hepatomegaly, splenomegaly or mass.     Tenderness: There is no abdominal tenderness.     Hernia: There is no hernia in the left inguinal area.  Musculoskeletal:        General: Normal range of motion.     Cervical back: Normal range of motion and neck supple.     Right lower leg: No edema.     Left lower leg: No edema.  Lymphadenopathy:     Cervical: No cervical adenopathy.  Skin:    General: Skin is warm and dry.     Findings: No rash.  Neurological:  Mental Status: He is alert and oriented to person, place, and time.     Sensory: No sensory deficit.     Deep Tendon Reflexes: Reflexes are normal and symmetric.  Psychiatric:        Mood and Affect: Mood is not anxious or depressed.      Wt Readings from Last 3 Encounters:  10/15/23 229 lb (103.9 kg)  07/17/23 226 lb 3.2 oz (102.6 kg)  05/10/23 225 lb (102.1 kg)    BP 126/78   Pulse 91   Ht 6\' 1"  (1.854 m)   Wt 229 lb (103.9 kg)   SpO2 98%   BMI 30.21 kg/m   Assessment and Plan: 1. Essential hypertension (Primary) Chronic.  Controlled.  Stable.  Blood pressure 126/78.  Asymptomatic.  Tolerating medication well.  Blood pressure today 126/78.  Asymptomatic.  Tolerating medication well.  Will continue amlodipine 5 mg once a day and valsartan 160 mg once a day.  Will check CMP for electrolytes and GFR.  Will recheck patient in 6 months. - amLODipine (NORVASC) 5 MG tablet; Take 1 tablet (5 mg total) by mouth daily.  Dispense: 90 tablet; Refill: 1 - valsartan (DIOVAN) 160 MG tablet; Take 1 tablet (160 mg total) by mouth daily.  Dispense: 90 tablet; Refill: 1 - Comprehensive metabolic panel  2. Mixed hyperlipidemia Chronic.  Controlled.  Stable.  Patient tolerating current dosing of atorvastatin 40 mg once a day without myalgias or muscle weakness.  We will continue to monitor this patient has been given low-cholesterol low triglyceride dietary guidelines to follow as well. - atorvastatin (LIPITOR) 40 MG tablet; TAKE 1 TABLET(40 MG) BY MOUTH DAILY  Dispense: 90 tablet; Refill: 1  3. Anxiety and depression Chronic.  Controlled.  Stable.  PHQ 0 GAD score is 0 continue with the combination of Wellbutrin SR 100 mg twice a day and paroxetine 20 mg once a day.  Will recheck patient in 6 months. - buPROPion ER (WELLBUTRIN SR) 100 MG 12 hr tablet; Take 1 tablet (100 mg total) by mouth 2 (two) times daily.  Dispense: 180 tablet; Refill: 1 - PARoxetine (PAXIL) 20 MG tablet; TAKE 1 TABLET(20 MG) BY MOUTH DAILY  Dispense: 90 tablet; Refill: 1  4. Benign prostatic hyperplasia with urinary frequency Chronic.  Controlled.  Stable.  Except for nocturia doing well.  DRE was done last visit and it was normal.  Will continue with  tamsulosin 0.4 once a day.  Will recheck in 6 months. - tamsulosin (FLOMAX) 0.4 MG CAPS capsule; TAKE 1 CAPSULE(0.4 MG) BY MOUTH DAILY  Dispense: 90 capsule; Refill: 1  5. Cervicalgia Chronic.  Controlled.  Stable.  Current dosings of cyclobenzaprine 10 mg of meloxicam is doing well to control patient's chronic neck pain. - cyclobenzaprine (FLEXERIL) 10 MG tablet; Take 1 tablet (10 mg total) by mouth at bedtime as needed for muscle spasms.  Dispense: 90 tablet; Refill: 1  6. Herpes zoster without complication As noted above - meloxicam (MOBIC) 7.5 MG tablet; Take 1-2 tablets (7.5-15 mg total) by mouth daily as needed for pain.  Dispense: 90 tablet; Refill: 1  7. Prediabetes Chronic.  Controlled.  Stable.  In addition to monitoring his concentrated sweets and carbohydrate intake patient is doing well with current regimen.  We will check A1c for current level of control - Hemoglobin A1c     Elizabeth Sauer, MD

## 2023-10-16 ENCOUNTER — Encounter: Payer: Self-pay | Admitting: Family Medicine

## 2023-10-16 ENCOUNTER — Ambulatory Visit (INDEPENDENT_AMBULATORY_CARE_PROVIDER_SITE_OTHER): Payer: Medicare Other

## 2023-10-16 ENCOUNTER — Other Ambulatory Visit: Payer: Self-pay | Admitting: Family Medicine

## 2023-10-16 DIAGNOSIS — B029 Zoster without complications: Secondary | ICD-10-CM

## 2023-10-16 DIAGNOSIS — Z0000 Encounter for general adult medical examination without abnormal findings: Secondary | ICD-10-CM | POA: Diagnosis not present

## 2023-10-16 LAB — HEMOGLOBIN A1C
Est. average glucose Bld gHb Est-mCnc: 123 mg/dL
Hgb A1c MFr Bld: 5.9 % — ABNORMAL HIGH (ref 4.8–5.6)

## 2023-10-16 LAB — COMPREHENSIVE METABOLIC PANEL
ALT: 30 IU/L (ref 0–44)
AST: 20 IU/L (ref 0–40)
Albumin: 4.4 g/dL (ref 3.8–4.8)
Alkaline Phosphatase: 68 IU/L (ref 44–121)
BUN/Creatinine Ratio: 10 (ref 10–24)
BUN: 10 mg/dL (ref 8–27)
Bilirubin Total: 0.4 mg/dL (ref 0.0–1.2)
CO2: 20 mmol/L (ref 20–29)
Calcium: 9.7 mg/dL (ref 8.6–10.2)
Chloride: 101 mmol/L (ref 96–106)
Creatinine, Ser: 0.97 mg/dL (ref 0.76–1.27)
Globulin, Total: 2.3 g/dL (ref 1.5–4.5)
Glucose: 112 mg/dL — ABNORMAL HIGH (ref 70–99)
Potassium: 4.7 mmol/L (ref 3.5–5.2)
Sodium: 138 mmol/L (ref 134–144)
Total Protein: 6.7 g/dL (ref 6.0–8.5)
eGFR: 83 mL/min/{1.73_m2} (ref >59)

## 2023-10-16 NOTE — Progress Notes (Signed)
 Subjective:   Dennis Navarro is a 73 y.o. who presents for a Medicare Wellness preventive visit.  Visit Complete: Virtual I connected with  Dennis Navarro on 10/16/23 by a audio enabled telemedicine application and verified that I am speaking with the correct person using two identifiers.  Patient Location: Home  Provider Location: Office/Clinic  I discussed the limitations of evaluation and management by telemedicine. The patient expressed understanding and agreed to proceed.  Vital Signs: Because this visit was a virtual/telehealth visit, some criteria may be missing or patient reported. Any vitals not documented were not able to be obtained and vitals that have been documented are patient reported.  VideoDeclined- This patient declined Librarian, academic. Therefore the visit was completed with audio only.  AWV Questionnaire: No: Patient Medicare AWV questionnaire was not completed prior to this visit.  Cardiac Risk Factors include: advanced age (>6men, >48 women);dyslipidemia;hypertension;male gender;obesity (BMI >30kg/m2);sedentary lifestyle     Objective:    Today's Vitals   10/16/23 1448  PainSc: 0-No pain   There is no height or weight on file to calculate BMI.     10/16/2023    2:55 PM 10/11/2022    9:50 AM 11/09/2021    7:01 AM 10/04/2021   10:13 AM  Advanced Directives  Does Patient Have a Medical Advance Directive? No No  No  Would patient like information on creating a medical advance directive? No - Patient declined No - Patient declined No - Patient declined No - Patient declined    Current Medications (verified) Outpatient Encounter Medications as of 10/16/2023  Medication Sig   amLODipine (NORVASC) 5 MG tablet Take 1 tablet (5 mg total) by mouth daily.   atorvastatin (LIPITOR) 40 MG tablet TAKE 1 TABLET(40 MG) BY MOUTH DAILY   buPROPion ER (WELLBUTRIN SR) 100 MG 12 hr tablet Take 1 tablet (100 mg total) by mouth 2 (two) times  daily.   cyclobenzaprine (FLEXERIL) 10 MG tablet Take 1 tablet (10 mg total) by mouth at bedtime as needed for muscle spasms.   meloxicam (MOBIC) 7.5 MG tablet Take 1-2 tablets (7.5-15 mg total) by mouth daily as needed for pain.   PARoxetine (PAXIL) 20 MG tablet TAKE 1 TABLET(20 MG) BY MOUTH DAILY   sildenafil (VIAGRA) 100 MG tablet Take 0.5-1 tablets (50-100 mg total) by mouth daily as needed for erectile dysfunction.   tamsulosin (FLOMAX) 0.4 MG CAPS capsule TAKE 1 CAPSULE(0.4 MG) BY MOUTH DAILY   valsartan (DIOVAN) 160 MG tablet Take 1 tablet (160 mg total) by mouth daily.   No facility-administered encounter medications on file as of 10/16/2023.    Allergies (verified) Patient has no known allergies.   History: Past Medical History:  Diagnosis Date   Anxiety    BPH (benign prostatic hyperplasia)    Depression    Hyperlipidemia    Hypertension    Skin cancer    Sleep apnea    CPAP   Wears dentures    partial upper   Past Surgical History:  Procedure Laterality Date   APPENDECTOMY     COLONOSCOPY  2013   cleared for 5 yrs- Dr Mechele Collin   COLONOSCOPY WITH PROPOFOL N/A 11/09/2021   Procedure: COLONOSCOPY WITH PROPOFOL;  Surgeon: Midge Minium, MD;  Location: [facility];  Service: Endoscopy;  Laterality: N/A;  sleep apnea   HAND SURGERY Bilateral    HERNIA REPAIR     x 3   POLYPECTOMY  11/09/2021   Procedure: POLYPECTOMY;  Surgeon:  Midge Minium, MD;  Location: [facility];  Service: Endoscopy;;   SEPTOPLASTY     SKIN CANCER EXCISION     under tongue   squamous cell cancer removal from chest     Family History  Problem Relation Age of Onset   Prostate cancer Neg Hx    Kidney cancer Neg Hx    Bladder Cancer Neg Hx    Social History   Socioeconomic History   Marital status: Married    Spouse name: Not on file   Number of children: Not on file   Years of education: Not on file   Highest education level: Not on file  Occupational History   Not on file   Tobacco Use   Smoking status: Former    Current packs/day: 0.00    Types: Cigarettes    Quit date: 80    Years since quitting: 48.2   Smokeless tobacco: Never   Tobacco comments:    quit 40 years (smoked from age 89 to age 35)  Vaping Use   Vaping status: Never Used  Substance and Sexual Activity   Alcohol use: Yes    Alcohol/week: 12.0 standard drinks of alcohol    Types: 12 Cans of beer per week   Drug use: No   Sexual activity: Yes  Other Topics Concern   Not on file  Social History Narrative   Not on file   Social Drivers of Health   Financial Resource Strain: Low Risk  (10/16/2023)   Overall Financial Resource Strain (CARDIA)    Difficulty of Paying Living Expenses: Not hard at all  Food Insecurity: No Food Insecurity (10/16/2023)   Hunger Vital Sign    Worried About Running Out of Food in the Last Year: Never true    Ran Out of Food in the Last Year: Never true  Transportation Needs: No Transportation Needs (10/16/2023)   PRAPARE - Administrator, Civil Service (Medical): No    Lack of Transportation (Non-Medical): No  Physical Activity: Insufficiently Active (10/16/2023)   Exercise Vital Sign    Days of Exercise per Week: 2 days    Minutes of Exercise per Session: 20 min  Stress: No Stress Concern Present (10/16/2023)   [employer] of Occupational Health - Occupational Stress Questionnaire    Feeling of Stress : Only a little  Social Connections: Moderately Isolated (10/16/2023)   Social Connection and Isolation Panel [NHANES]    Frequency of Communication with Friends and Family: More than three times a week    Frequency of Social Gatherings with Friends and Family: More than three times a week    Attends Religious Services: Never    Database administrator or Organizations: No    Attends Engineer, structural: Never    Marital Status: Married    Tobacco Counseling Counseling given: Not Answered Tobacco comments: quit 40 years (smoked from  age 52 to age 77)    Clinical Intake:  Pre-visit preparation completed: Yes  Pain : No/denies pain Pain Score: 0-No pain     BMI - recorded: 30.2 Nutritional Status: BMI > 30  Obese Nutritional Risks: None Diabetes: No  How often do you need to have someone help you when you read instructions, pamphlets, or other written materials from your doctor or pharmacy?: 1 - Never  Interpreter Needed?: No  Information entered by :: Kennedy Bucker, LPN   Activities of Daily Living     10/16/2023    2:58 PM  In your present state of health, do you have any difficulty performing the following activities:  Hearing? 0  Vision? 0  Difficulty concentrating or making decisions? 0  Walking or climbing stairs? 0  Dressing or bathing? 0  Doing errands, shopping? 0  Preparing Food and eating ? N  Using the Toilet? N  In the past six months, have you accidently leaked urine? N  Do you have problems with loss of bowel control? N  Managing your Medications? N  Managing your Finances? N  Housekeeping or managing your Housekeeping? N    Patient Care Team: Duanne Limerick, MD as PCP - [facility] (Family [facility], [facility] (Optometry)  Indicate any recent Medical Services you may have received from other than [facility] providers in the past year (date may be approximate).     Assessment:   This is a routine wellness examination for Chrissie Noa.  Hearing/Vision screen Hearing Screening - Comments:: WEARS AIDS, BOTH EARS  Vision Screening - Comments:: WEARS GLASSES MOST OF TIME- [facility]   Goals Addressed             This Visit's Progress    DIET - INCREASE WATER INTAKE         Depression Screen     10/16/2023    2:52 PM 10/15/2023    9:22 AM 07/17/2023   10:12 AM 05/10/2023    8:50 AM 10/11/2022    9:51 AM 05/31/2022   10:18 AM 04/19/2022    8:01 AM  PHQ 2/9 Scores  PHQ - 2 Score 0 0 1 0 0 0 2  PHQ- 9 Score 0 0 1 0 0 0 7    Fall Risk     10/16/2023    2:57 PM 10/15/2023     9:22 AM 07/17/2023   10:11 AM 05/10/2023    8:49 AM 10/11/2022    9:50 AM  Fall Risk   Falls in the past year? 1 1 1 1  0  Number falls in past yr: 1 1 0 0 0  Injury with Fall? 1 1 1 1  0  Risk for fall due to : History of fall(s) No Fall Risks  No Fall Risks No Fall Risks  Follow up Falls evaluation completed;Falls prevention discussed Falls evaluation completed Falls evaluation completed  Falls prevention discussed;Falls evaluation completed    MEDICARE RISK AT HOME:  Medicare Risk at Home Any stairs in or around the home?: Yes If so, are there any without handrails?: No Home free of loose throw rugs in walkways, pet beds, electrical cords, etc?: Yes Adequate lighting in your home to reduce risk of falls?: Yes Life alert?: No Use of a cane, walker or w/c?: No Grab bars in the bathroom?: Yes Shower chair or bench in shower?: No Elevated toilet seat or a handicapped toilet?: Yes  TIMED UP AND GO:  Was the test performed?  No  Cognitive Function: 6CIT completed        10/16/2023    3:00 PM 10/11/2022    9:55 AM  6CIT Screen  What Year? 0 points 0 points  What month? 0 points 0 points  What time? 0 points 0 points  Count back from 20 0 points 0 points  Months in reverse 0 points 0 points  Repeat phrase 0 points 6 points  Total Score 0 points 6 points    Immunizations Immunization History  Administered Date(s) Administered   Fluad Quad(high Dose 65+) 04/19/2022   Fluad Trivalent(High Dose 65+) 05/10/2023  Influenza, High Dose Seasonal PF 06/26/2018, 06/30/2020, 07/12/2021   Influenza,inj,Quad PF,6+ Mos 09/12/2016, 04/22/2017   Influenza-Unspecified 06/26/2018, 05/04/2019   PFIZER(Purple Top)SARS-COV-2 Vaccination 09/14/2019, 10/14/2019, 06/10/2020   Pfizer Covid-19 Vaccine Bivalent Booster 3yrs & up 07/04/2021   Pfizer(Comirnaty)Fall Seasonal Vaccine 12 years and older 09/03/2022   Pneumococcal Conjugate-13 09/12/2016   Pneumococcal Polysaccharide-23 01/08/2018    Tdap 01/22/2016   Zoster Recombinant(Shingrix) 03/02/2019, 05/04/2019    Screening Tests Health Maintenance  Topic Date Due   COVID-19 Vaccine (6 - 2024-25 season) 04/14/2023   Hepatitis C Screening  05/09/2024 (Originally 07/17/1969)   Medicare Annual Wellness (AWV)  10/15/2024   DTaP/Tdap/Td (2 - Td or Tdap) 01/21/2026   Colonoscopy  11/10/2026   Pneumonia Vaccine 72+ Years old  Completed   INFLUENZA VACCINE  Completed   Zoster Vaccines- Shingrix  Completed   HPV VACCINES  Aged Out    Health Maintenance  Health Maintenance Due  Topic Date Due   COVID-19 Vaccine (6 - 2024-25 season) 04/14/2023   Health Maintenance Items Addressed: UP TO DATE ON SHOTS, UP TO DATE ON COLONOSCOPY  Additional Screening:  Vision Screening: Recommended annual ophthalmology exams for early detection of glaucoma and other disorders of the eye.  Dental Screening: Recommended annual dental exams for proper oral hygiene  Community Resource Referral / Chronic Care Management: CRR required this visit?  No   CCM required this visit?  No     Plan:     I have personally reviewed and noted the following in the patient's chart:   Medical and social history Use of alcohol, tobacco or illicit drugs  Current medications and supplements including opioid prescriptions. Patient is not currently taking opioid prescriptions. Functional ability and status Nutritional status Physical activity Advanced directives List of other physicians Hospitalizations, surgeries, and ER visits in previous 12 months Vitals Screenings to include cognitive, depression, and falls Referrals and appointments  In addition, I have reviewed and discussed with patient certain preventive protocols, quality metrics, and best practice recommendations. A written personalized care plan for preventive services as well as general preventive health recommendations were provided to patient.     Hal Hope, LPN   03/14/9561    After Visit Summary: (MyChart) Due to this being a telephonic visit, the after visit summary with patients personalized plan was offered to patient via MyChart   Notes: Nothing significant to report at this time.

## 2023-10-16 NOTE — Patient Instructions (Addendum)
 Dennis Navarro , Thank you for taking time to come for your Medicare Wellness Visit. I appreciate your ongoing commitment to your health goals. Please review the following plan we discussed and let me know if I can assist you in the future.   Referrals/Orders/Follow-Ups/Clinician Recommendations: NONE  This is a list of the screening recommended for you and due dates:  Health Maintenance  Topic Date Due   COVID-19 Vaccine (6 - 2024-25 season) 04/14/2023   Hepatitis C Screening  05/09/2024*   Medicare Annual Wellness Visit  10/15/2024   DTaP/Tdap/Td vaccine (2 - Td or Tdap) 01/21/2026   Colon Cancer Screening  11/10/2026   Pneumonia Vaccine  Completed   Flu Shot  Completed   Zoster (Shingles) Vaccine  Completed   HPV Vaccine  Aged Out  *Topic was postponed. The date shown is not the original due date.    Advanced directives: (ACP Link)Information on Advanced Care Planning can be found at [facility] Advance Health Care Directives Advance Health Care Directives ([email or web address]   Next Medicare Annual Wellness Visit scheduled for next year: Yes   10/21/24 @ 12:40 PM BY PHONE

## 2023-10-17 ENCOUNTER — Ambulatory Visit: Payer: Self-pay | Admitting: Family Medicine

## 2023-10-17 DIAGNOSIS — M2762 Post-osseointegration biological failure of dental implant: Secondary | ICD-10-CM | POA: Diagnosis not present

## 2023-10-27 DIAGNOSIS — G4733 Obstructive sleep apnea (adult) (pediatric): Secondary | ICD-10-CM | POA: Diagnosis not present

## 2023-10-29 DIAGNOSIS — G4733 Obstructive sleep apnea (adult) (pediatric): Secondary | ICD-10-CM | POA: Diagnosis not present

## 2023-11-29 DIAGNOSIS — G4733 Obstructive sleep apnea (adult) (pediatric): Secondary | ICD-10-CM | POA: Diagnosis not present

## 2023-12-03 ENCOUNTER — Other Ambulatory Visit: Payer: Self-pay | Admitting: Family Medicine

## 2023-12-03 DIAGNOSIS — B029 Zoster without complications: Secondary | ICD-10-CM

## 2023-12-29 DIAGNOSIS — G4733 Obstructive sleep apnea (adult) (pediatric): Secondary | ICD-10-CM | POA: Diagnosis not present

## 2024-01-29 DIAGNOSIS — G4733 Obstructive sleep apnea (adult) (pediatric): Secondary | ICD-10-CM | POA: Diagnosis not present

## 2024-03-26 DIAGNOSIS — Z859 Personal history of malignant neoplasm, unspecified: Secondary | ICD-10-CM | POA: Diagnosis not present

## 2024-03-26 DIAGNOSIS — Z86018 Personal history of other benign neoplasm: Secondary | ICD-10-CM | POA: Diagnosis not present

## 2024-03-26 DIAGNOSIS — L578 Other skin changes due to chronic exposure to nonionizing radiation: Secondary | ICD-10-CM | POA: Diagnosis not present

## 2024-03-26 DIAGNOSIS — L570 Actinic keratosis: Secondary | ICD-10-CM | POA: Diagnosis not present

## 2024-03-26 DIAGNOSIS — Z872 Personal history of diseases of the skin and subcutaneous tissue: Secondary | ICD-10-CM | POA: Diagnosis not present

## 2024-04-02 ENCOUNTER — Ambulatory Visit: Admitting: Physician Assistant

## 2024-04-06 ENCOUNTER — Encounter: Payer: Self-pay | Admitting: Student

## 2024-04-06 ENCOUNTER — Ambulatory Visit (INDEPENDENT_AMBULATORY_CARE_PROVIDER_SITE_OTHER): Admitting: Student

## 2024-04-06 VITALS — BP 130/82 | HR 96 | Ht 73.0 in | Wt 232.0 lb

## 2024-04-06 DIAGNOSIS — E669 Obesity, unspecified: Secondary | ICD-10-CM | POA: Insufficient documentation

## 2024-04-06 DIAGNOSIS — M542 Cervicalgia: Secondary | ICD-10-CM

## 2024-04-06 DIAGNOSIS — E66811 Obesity, class 1: Secondary | ICD-10-CM | POA: Diagnosis not present

## 2024-04-06 DIAGNOSIS — R7303 Prediabetes: Secondary | ICD-10-CM | POA: Insufficient documentation

## 2024-04-06 DIAGNOSIS — B029 Zoster without complications: Secondary | ICD-10-CM

## 2024-04-06 DIAGNOSIS — N401 Enlarged prostate with lower urinary tract symptoms: Secondary | ICD-10-CM | POA: Insufficient documentation

## 2024-04-06 DIAGNOSIS — E782 Mixed hyperlipidemia: Secondary | ICD-10-CM | POA: Diagnosis not present

## 2024-04-06 DIAGNOSIS — F419 Anxiety disorder, unspecified: Secondary | ICD-10-CM | POA: Diagnosis not present

## 2024-04-06 DIAGNOSIS — I10 Essential (primary) hypertension: Secondary | ICD-10-CM

## 2024-04-06 DIAGNOSIS — R350 Frequency of micturition: Secondary | ICD-10-CM

## 2024-04-06 DIAGNOSIS — F32A Depression, unspecified: Secondary | ICD-10-CM

## 2024-04-06 DIAGNOSIS — N529 Male erectile dysfunction, unspecified: Secondary | ICD-10-CM

## 2024-04-06 DIAGNOSIS — Z6830 Body mass index (BMI) 30.0-30.9, adult: Secondary | ICD-10-CM

## 2024-04-06 DIAGNOSIS — D124 Benign neoplasm of descending colon: Secondary | ICD-10-CM

## 2024-04-06 MED ORDER — BUPROPION HCL ER (SR) 100 MG PO TB12: 100.0000 mg | ORAL_TABLET | Freq: Two times a day (BID) | ORAL | 1 refills | Status: AC

## 2024-04-06 MED ORDER — AMLODIPINE BESYLATE 5 MG PO TABS: 5.0000 mg | ORAL_TABLET | Freq: Every day | ORAL | 1 refills | Status: DC

## 2024-04-06 MED ORDER — MELOXICAM 7.5 MG PO TABS: 7.5000 mg | ORAL_TABLET | Freq: Every day | ORAL | 1 refills | Status: AC | PRN

## 2024-04-06 MED ORDER — PAROXETINE HCL 20 MG PO TABS: ORAL_TABLET | ORAL | 1 refills | Status: AC

## 2024-04-06 MED ORDER — ATORVASTATIN CALCIUM 40 MG PO TABS: ORAL_TABLET | ORAL | 1 refills | Status: DC

## 2024-04-06 MED ORDER — CYCLOBENZAPRINE HCL 10 MG PO TABS: 10.0000 mg | ORAL_TABLET | Freq: Every evening | ORAL | 1 refills | Status: AC | PRN

## 2024-04-06 MED ORDER — TAMSULOSIN HCL 0.4 MG PO CAPS: ORAL_CAPSULE | ORAL | 1 refills | Status: DC

## 2024-04-06 MED ORDER — VALSARTAN 160 MG PO TABS: 160.0000 mg | ORAL_TABLET | Freq: Every day | ORAL | 1 refills | Status: AC

## 2024-04-06 MED ORDER — SILDENAFIL CITRATE 100 MG PO TABS: 50.0000 mg | ORAL_TABLET | Freq: Every day | ORAL | 5 refills | Status: AC | PRN

## 2024-04-06 NOTE — Assessment & Plan Note (Signed)
 Doing flexeril  10 mg a night time. Denies excessive sedation. Continue to monitor given also on flomax  and paxil . Continue current medication.

## 2024-04-06 NOTE — Assessment & Plan Note (Signed)
 Uses meal delivery service. Has not been walking as much lately. Discussed increasing physical activity and continue with dietary modifications.

## 2024-04-06 NOTE — Assessment & Plan Note (Signed)
 He was depressed 40 years ago and was placed on paxil  20, and unable to wean off paxil . Mood is well controlled. Denies dizziness or falls. Continue paxil  20 mg daily and wellbutrin  100 mg twice daily.

## 2024-04-06 NOTE — Assessment & Plan Note (Signed)
 Well controlled on valsartan  160 mg daily and amlodipine  5 mg daily. Continue current medications.

## 2024-04-06 NOTE — Progress Notes (Unsigned)
 Established Patient Office Visit  Subjective   Patient ID: Dennis Navarro, male    DOB: 03-08-1951  Age: 73 y.o. MRN: [id number]  Chief Complaint  Patient presents with   Transitions Of Care    Dennis Navarro with medical hx listed below presents today for transfer of care. Feeling well today no acute complaints. Previously seeing Dr. Joshua until she retired. Would like to schedule flu vaccine.   Started walking more lately not walking much  Walking every day for about 1 miles  20,   Patient Active Problem List   Diagnosis Date Noted   Prediabetes 04/06/2024   Herpes zoster without complication 07/18/2023   Cervicalgia 07/18/2023   Screen for colon cancer 07/04/2023   Polyp of descending colon    Nocturia 09/12/2016   Essential hypertension 12/15/2015   Mixed hyperlipidemia 12/15/2015   Anxiety and depression 12/15/2015      ROS Refer to HPI    Objective:     Outpatient Encounter Medications as of 04/06/2024  Medication Sig   amLODipine  (NORVASC ) 5 MG tablet Take 1 tablet (5 mg total) by mouth daily.   atorvastatin  (LIPITOR) 40 MG tablet TAKE 1 TABLET(40 MG) BY MOUTH DAILY   buPROPion  ER (WELLBUTRIN  SR) 100 MG 12 hr tablet Take 1 tablet (100 mg total) by mouth 2 (two) times daily.   cyclobenzaprine  (FLEXERIL ) 10 MG tablet Take 1 tablet (10 mg total) by mouth at bedtime as needed for muscle spasms.   meloxicam  (MOBIC ) 7.5 MG tablet TAKE 1 TO 2 TABLETS(7.5 TO 15 MG) BY MOUTH DAILY AS NEEDED FOR PAIN   PARoxetine  (PAXIL ) 20 MG tablet TAKE 1 TABLET(20 MG) BY MOUTH DAILY   sildenafil  (VIAGRA ) 100 MG tablet Take 0.5-1 tablets (50-100 mg total) by mouth daily as needed for erectile dysfunction.   tamsulosin  (FLOMAX ) 0.4 MG CAPS capsule TAKE 1 CAPSULE(0.4 MG) BY MOUTH DAILY   valsartan  (DIOVAN ) 160 MG tablet Take 1 tablet (160 mg total) by mouth daily.   No facility-administered encounter medications on file as of 04/06/2024.    Ht 6' 1 (1.854 m)   BMI 30.21 kg/m   BP Readings from Last 3 Encounters:  10/15/23 126/78  07/17/23 (!) 130/100  05/10/23 129/78    Physical Exam     10/16/2023    2:52 PM 10/15/2023    9:22 AM 07/17/2023   10:12 AM  Depression screen PHQ 2/9  Decreased Interest 0 0 0  Down, Depressed, Hopeless 0 0 1  PHQ - 2 Score 0 0 1  Altered sleeping 0 0 0  Tired, decreased energy 0 0 0  Change in appetite 0 0 0  Feeling bad or failure about yourself  0 0 0  Trouble concentrating 0 0 0  Moving slowly or fidgety/restless 0 0 0  Suicidal thoughts 0 0 0  PHQ-9 Score 0 0 1  Difficult doing work/chores Not difficult at all Not difficult at all Not difficult at all       10/15/2023    9:22 AM 07/17/2023   10:13 AM 05/10/2023    8:50 AM 05/31/2022   10:18 AM  GAD 7 : Generalized Anxiety Score  Nervous, Anxious, on Edge 0 0 0 0  Control/stop worrying 0 0 0 0  Worry too much - different things 0 0 0 0  Trouble relaxing 0 0 0 0  Restless 0 1 0 0  Easily annoyed or irritable 0 0 0 0  Afraid - awful might happen 0  0 0 0  Total GAD 7 Score 0 1 0 0  Anxiety Difficulty Not difficult at all Not difficult at all Not difficult at all Not difficult at all    No results found for any visits on 04/06/24.  Last CBC Lab Results  Component Value Date   WBC 7.2 05/06/2017   HGB 14.1 05/06/2017   HCT 41.9 05/06/2017   MCV 92 05/06/2017   MCH 30.8 05/06/2017   RDW 13.5 05/06/2017   PLT 262 05/06/2017   Last metabolic panel Lab Results  Component Value Date   GLUCOSE 112 (H) 10/15/2023   NA 138 10/15/2023   K 4.7 10/15/2023   CL 101 10/15/2023   CO2 20 10/15/2023   BUN 10 10/15/2023   CREATININE 0.97 10/15/2023   EGFR 83 10/15/2023   CALCIUM  9.7 10/15/2023   PHOS 2.9 10/18/2022   PROT 6.7 10/15/2023   ALBUMIN 4.4 10/15/2023   LABGLOB 2.3 10/15/2023   AGRATIO 1.9 04/19/2022   BILITOT 0.4 10/15/2023   ALKPHOS 68 10/15/2023   AST 20 10/15/2023   ALT 30 10/15/2023   ANIONGAP 11 09/06/2011   Last lipids Lab Results   Component Value Date   CHOL 192 05/10/2023   HDL 102 05/10/2023   LDLCALC 75 05/10/2023   TRIG 82 05/10/2023   CHOLHDL 2.2 01/08/2018   Last hemoglobin A1c Lab Results  Component Value Date   HGBA1C 5.9 (H) 10/15/2023   Last thyroid  functions Lab Results  Component Value Date   TSH 1.920 05/06/2017      The ASCVD Risk score (Arnett DK, et al., 2019) failed to calculate for the following reasons:   The valid HDL cholesterol range is 20 to 100 mg/dL    Assessment & Plan:  There are no diagnoses linked to this encounter.   No follow-ups on file.    Harlene Saddler, MD

## 2024-04-07 ENCOUNTER — Ambulatory Visit: Payer: Self-pay | Admitting: Student

## 2024-04-07 ENCOUNTER — Encounter: Payer: Self-pay | Admitting: Student

## 2024-04-07 ENCOUNTER — Other Ambulatory Visit: Payer: Self-pay

## 2024-04-07 DIAGNOSIS — N529 Male erectile dysfunction, unspecified: Secondary | ICD-10-CM | POA: Insufficient documentation

## 2024-04-07 LAB — LIPID PANEL
Chol/HDL Ratio: 2.7 ratio (ref 0.0–5.0)
Cholesterol, Total: 212 mg/dL — ABNORMAL HIGH (ref 100–199)
HDL: 80 mg/dL (ref >39)
LDL Chol Calc (NIH): 85 mg/dL (ref 0–99)
Triglycerides: 292 mg/dL — ABNORMAL HIGH (ref 0–149)
VLDL Cholesterol Cal: 47 mg/dL — ABNORMAL HIGH (ref 5–40)

## 2024-04-07 LAB — COMPREHENSIVE METABOLIC PANEL WITH GFR
ALT: 26 IU/L (ref 0–44)
AST: 20 IU/L (ref 0–40)
Albumin: 4.3 g/dL (ref 3.8–4.8)
Alkaline Phosphatase: 60 IU/L (ref 44–121)
BUN/Creatinine Ratio: 13 (ref 10–24)
BUN: 15 mg/dL (ref 8–27)
Bilirubin Total: 0.3 mg/dL (ref 0.0–1.2)
CO2: 22 mmol/L (ref 20–29)
Calcium: 9.4 mg/dL (ref 8.6–10.2)
Chloride: 103 mmol/L (ref 96–106)
Creatinine, Ser: 1.14 mg/dL (ref 0.76–1.27)
Globulin, Total: 2.4 g/dL (ref 1.5–4.5)
Glucose: 97 mg/dL (ref 70–99)
Potassium: 4.4 mmol/L (ref 3.5–5.2)
Sodium: 139 mmol/L (ref 134–144)
Total Protein: 6.7 g/dL (ref 6.0–8.5)
eGFR: 68 mL/min/1.73 (ref >59)

## 2024-04-07 LAB — HEMOGLOBIN A1C
Est. average glucose Bld gHb Est-mCnc: 123 mg/dL
Hgb A1c MFr Bld: 5.9 % — ABNORMAL HIGH (ref 4.8–5.6)

## 2024-04-07 MED ORDER — ATORVASTATIN CALCIUM 80 MG PO TABS: 80.0000 mg | ORAL_TABLET | Freq: Every day | ORAL | 1 refills | Status: AC

## 2024-04-07 NOTE — Assessment & Plan Note (Signed)
 Lab Results  Component Value Date   HGBA1C 5.9 (H) 04/06/2024   HGBA1C 5.9 (H) 10/15/2023   HGBA1C 5.9 (H) 05/10/2023   A1c today, continue diet and exercise.

## 2024-04-07 NOTE — Assessment & Plan Note (Addendum)
 Currently on atorvastatin  40 mg daily for primary prevention. Lipid panel today.

## 2024-04-07 NOTE — Assessment & Plan Note (Signed)
 About 1 nighttime awakening daily. Cell controled on flomax  0.4 mg daily. PSA at next visit.

## 2024-04-21 ENCOUNTER — Ambulatory Visit (INDEPENDENT_AMBULATORY_CARE_PROVIDER_SITE_OTHER)

## 2024-04-21 DIAGNOSIS — Z23 Encounter for immunization: Secondary | ICD-10-CM | POA: Diagnosis not present

## 2024-04-29 DIAGNOSIS — G4733 Obstructive sleep apnea (adult) (pediatric): Secondary | ICD-10-CM | POA: Diagnosis not present

## 2024-07-09 ENCOUNTER — Other Ambulatory Visit: Payer: Self-pay | Admitting: Student

## 2024-07-14 NOTE — Telephone Encounter (Signed)
 Rx refused. Pt has requested too soon.

## 2024-07-14 NOTE — Telephone Encounter (Signed)
 Requested Prescriptions  Refused Prescriptions Disp Refills   atorvastatin  (LIPITOR) 80 MG tablet [Pharmacy Med Name: ATORVASTATIN  80MG  TABLETS] 90 tablet 1    Sig: TAKE 1 TABLET(80 MG) BY MOUTH DAILY     Cardiovascular:  Antilipid - Statins Failed - 07/14/2024 11:42 AM      Failed - Lipid Panel in normal range within the last 12 months    Cholesterol, Total  Date Value Ref Range Status  04/06/2024 212 (H) 100 - 199 mg/dL Final   LDL Chol Calc (NIH)  Date Value Ref Range Status  04/06/2024 85 0 - 99 mg/dL Final   HDL  Date Value Ref Range Status  04/06/2024 80 >39 mg/dL Final   Triglycerides  Date Value Ref Range Status  04/06/2024 292 (H) 0 - 149 mg/dL Final         Passed - Patient is not pregnant      Passed - Valid encounter within last 12 months    Recent Outpatient Visits           3 months ago Prediabetes   [place] [facility], Harlene, MD   9 months ago Essential hypertension   [place] [facility] Cathryne JAYSON, MD

## 2024-08-11 ENCOUNTER — Other Ambulatory Visit: Payer: Self-pay

## 2024-08-11 DIAGNOSIS — N401 Enlarged prostate with lower urinary tract symptoms: Secondary | ICD-10-CM

## 2024-08-11 MED ORDER — TAMSULOSIN HCL 0.4 MG PO CAPS: ORAL_CAPSULE | ORAL | 1 refills | Status: AC

## 2024-09-09 ENCOUNTER — Other Ambulatory Visit: Payer: Self-pay | Admitting: Student

## 2024-09-09 DIAGNOSIS — I10 Essential (primary) hypertension: Secondary | ICD-10-CM

## 2024-09-10 NOTE — Telephone Encounter (Signed)
 Requested Prescriptions  Pending Prescriptions Disp Refills   amLODipine  (NORVASC ) 5 MG tablet [Pharmacy Med Name: AMLODIPINE  BESYLATE 5MG  TABLETS] 90 tablet 1    Sig: TAKE 1 TABLET(5 MG) BY MOUTH DAILY     Cardiovascular: Calcium  Channel Blockers 2 Passed - 09/10/2024 11:02 AM      Passed - Last BP in normal range    BP Readings from Last 1 Encounters:  04/06/24 130/82         Passed - Last Heart Rate in normal range    Pulse Readings from Last 1 Encounters:  04/06/24 96         Passed - Valid encounter within last 6 months    Recent Outpatient Visits           5 months ago Prediabetes   [facility], Harlene, MD   11 months ago Essential hypertension   [place] [facility] Cathryne JAYSON, MD

## 2024-10-06 ENCOUNTER — Encounter: Admitting: Student

## 2024-10-21 ENCOUNTER — Ambulatory Visit

## 2024-10-22 ENCOUNTER — Ambulatory Visit
